# Patient Record
Sex: Female | Born: 1940 | Race: Black or African American | Hispanic: No | Marital: Single | Smoking: Never smoker
Health system: Southern US, Community
[De-identification: ages and names within clinical notes are randomized; demographics above are authoritative.]

## PROBLEM LIST (undated history)

## (undated) DIAGNOSIS — D649 Anemia, unspecified: Secondary | ICD-10-CM

## (undated) DIAGNOSIS — J45909 Unspecified asthma, uncomplicated: Secondary | ICD-10-CM

## (undated) DIAGNOSIS — M1990 Unspecified osteoarthritis, unspecified site: Secondary | ICD-10-CM

## (undated) HISTORY — PX: KNEE ARTHROSCOPY: SHX127

---
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
## 2017-07-27 ENCOUNTER — Other Ambulatory Visit: Payer: Self-pay

## 2017-07-27 ENCOUNTER — Encounter (HOSPITAL_COMMUNITY): Payer: Self-pay

## 2017-07-27 ENCOUNTER — Observation Stay (HOSPITAL_COMMUNITY)
Admission: AD | Admit: 2017-07-27 | Discharge: 2017-07-28 | Disposition: A | Discharge status: Home / Self Care | Payer: 59 | Source: Ambulatory Visit | Attending: Family Medicine | Admitting: Family Medicine

## 2017-07-27 DIAGNOSIS — Z880 Allergy status to penicillin: Secondary | ICD-10-CM | POA: Diagnosis not present

## 2017-07-27 DIAGNOSIS — Z79899 Other long term (current) drug therapy: Secondary | ICD-10-CM | POA: Diagnosis not present

## 2017-07-27 DIAGNOSIS — Z853 Personal history of malignant neoplasm of breast: Secondary | ICD-10-CM | POA: Insufficient documentation

## 2017-07-27 DIAGNOSIS — I10 Essential (primary) hypertension: Secondary | ICD-10-CM | POA: Insufficient documentation

## 2017-07-27 DIAGNOSIS — D649 Anemia, unspecified: Secondary | ICD-10-CM | POA: Diagnosis not present

## 2017-07-27 LAB — COMPREHENSIVE METABOLIC PANEL
ALBUMIN: 3.2 g/dL — AB (ref 3.5–5.0)
ALT: 6 U/L — ABNORMAL LOW (ref 14–54)
ANION GAP: 6 (ref 5–15)
AST: 17 U/L (ref 15–41)
Alkaline Phosphatase: 68 U/L (ref 38–126)
BUN: 16 mg/dL (ref 6–20)
CHLORIDE: 108 mmol/L (ref 101–111)
CO2: 23 mmol/L (ref 22–32)
Calcium: 8.8 mg/dL — ABNORMAL LOW (ref 8.9–10.3)
Creatinine, Ser: 1.14 mg/dL — ABNORMAL HIGH (ref 0.44–1.00)
GFR calc Af Amer: 52 mL/min — ABNORMAL LOW (ref >60)
GFR calc non Af Amer: 45 mL/min — ABNORMAL LOW (ref >60)
GLUCOSE: 91 mg/dL (ref 65–99)
POTASSIUM: 3.8 mmol/L (ref 3.5–5.1)
SODIUM: 137 mmol/L (ref 135–145)
Total Bilirubin: 0.3 mg/dL (ref 0.3–1.2)
Total Protein: 6.4 g/dL — ABNORMAL LOW (ref 6.5–8.1)

## 2017-07-27 LAB — CBC
HEMATOCRIT: 22.6 % — AB (ref 36.0–46.0)
Hemoglobin: 6.9 g/dL — CL (ref 12.0–15.0)
MCH: 24.1 pg — ABNORMAL LOW (ref 26.0–34.0)
MCHC: 30.5 g/dL (ref 30.0–36.0)
MCV: 79 fL (ref 78.0–100.0)
PLATELETS: 460 10*3/uL — AB (ref 150–400)
RBC: 2.86 MIL/uL — ABNORMAL LOW (ref 3.87–5.11)
RDW: 17.6 % — AB (ref 11.5–15.5)
WBC: 5.8 10*3/uL (ref 4.0–10.5)

## 2017-07-27 LAB — PREPARE RBC (CROSSMATCH)

## 2017-07-27 LAB — ABO/RH: ABO/RH(D): A POS

## 2017-07-27 MED ORDER — ENSURE ENLIVE PO LIQD
237.0000 mL | Freq: Two times a day (BID) | ORAL | Status: DC
  Administered 2017-07-28: 237 mL via ORAL

## 2017-07-27 MED ORDER — SODIUM CHLORIDE 0.9 % IV SOLN
Freq: Once | INTRAVENOUS | Status: AC
  Administered 2017-07-27: 22:00:00 via INTRAVENOUS

## 2017-07-27 MED ORDER — DEXTROSE 5 % IV SOLN
INTRAVENOUS | Status: AC
  Administered 2017-07-27: 20:00:00 via INTRAVENOUS

## 2017-07-27 NOTE — Progress Notes (Signed)
CRITICAL VALUE ALERT  Critical Value: Hgb 6.9  Date & Time Notied: 07/27/17 @ 2010  Provider Notified: Dr. Parke SimmersBland  Orders Received/Actions taken: transfuse 2 units PRBc's

## 2017-07-27 NOTE — Progress Notes (Signed)
Telephone order obtained from Lowella BandyV. Bland, MD for 2 units PRBC's with repeat H&H 2 hours post transfusion. Consent reviewed with pt and pt's niece, Shanda BumpsJessica. Pt and family member made aware of risks and allowed to ask questions-pt and family had no questions and pt is in agreement with receiving the 2 units of PRBC's. Will ctm.

## 2017-07-28 DIAGNOSIS — D649 Anemia, unspecified: Secondary | ICD-10-CM | POA: Diagnosis not present

## 2017-07-28 LAB — HEMOGLOBIN AND HEMATOCRIT, BLOOD
HEMATOCRIT: 33.8 % — AB (ref 36.0–46.0)
HEMOGLOBIN: 10.6 g/dL — AB (ref 12.0–15.0)

## 2017-07-28 NOTE — Progress Notes (Signed)
Pt D/C'd home tonight per order, accompanied by niece, Shanda BumpsJessica. IV removed and D/C paperwork reviewed with pt and family member. Pt and family did not have any questions at this time. Pt has a f/u appointment already scheduled with Dr. Parke SimmersBland for next week. Belongings sent home with family member. Pt brought out to car by wheelchair. Will ctm.

## 2017-07-28 NOTE — Discharge Summary (Signed)
Mary ClackDorothy G Bagent MRN: [id number] DOB/AGE: 78-25-1942 78 y.o.  Admit date: 07/27/2017 Discharge date: 07/28/2017  Primary Care Physician:  Patient, No Pcp Per   Discharge Diagnoses:   There are no active problems to display for this patient. This patient has anemia as a problems that is present.  She also has decreased energy and some memory failure DISCHARGE MEDICATION: Allergies as of 07/28/2017      Reactions   Penicillins Hives   Has patient had a PCN reaction causing immediate rash, facial/tongue/throat swelling, SOB or lightheadedness with hypotension: Yes Has patient had a PCN reaction causing severe rash involving mucus membranes or skin necrosis: No Has patient had a PCN reaction that required hospitalization: Unknown Has patient had a PCN reaction occurring within the last 10 years: Unknown If all of the above answers are "NO", then may proceed with Cephalosporin use.      Medication List    TAKE these medications   acetaminophen 650 MG CR tablet Commonly known as:  TYLENOL Take 650 mg by mouth every 8 (eight) hours.   celecoxib 200 MG capsule Commonly known as:  CELEBREX Take 200 mg by mouth daily.   Fluticasone-Salmeterol 250-50 MCG/DOSE Aepb Commonly known as:  ADVAIR Inhale 1 puff into the lungs every 12 (twelve) hours.   omeprazole 20 MG capsule Commonly known as:  PRILOSEC Take 20 mg by mouth daily.   triamcinolone cream 0.1 % Commonly known as:  KENALOG Apply 1 application topically 2 (two) times daily.   triamterene-hydrochlorothiazide 37.5-25 MG tablet Commonly known as:  MAXZIDE-25 Take 1 tablet by mouth daily.   VITAMIN D3 PO Take 1 tablet by mouth daily.         Consults:    SIGNIFICANT DIAGNOSTIC STUDIES:  No results found.   ECHO:     CARDIAC CATH & OTHER PROCEDURES:  No results found for this or any previous visit (from the past 240 hour(s)).  BRIEF ADMITTING H & P    Hospital Course:  Present on  Admission: **None** This elerderly female has recently [place]  Her grand daughter has moved her in with her due to some memory failure after her husband died.  She complains of fatigue.  She was found on her first visit to our office to be anemic.   It appears that she had been asked by her previous doctor to get a colonoscopy but this had not been done.     Disposition and Follow-up:    She has been scheduled to see GI and to have a CT of the colon to further delineate her colon problems   Discharge Instructions    Diet - low sodium heart healthy   Complete by:  As directed    Increase activity slowly   Complete by:  As directed       DISCHARGE EXAM:  She appears in good spirits.  She says she has a little more energy . Head is normocephalic  She has poor dental health with missing teeth and caries  .  Chest was clear, Cor was regular. No S3 or S4.  No thrills or heaves present . Abdomen was soft, no masses were noted.  Extremities showed good pulses present .  Neurologically she was alert and talkative and appropriate.  She moves all of her extreities.  Blood pressure (!) 110/54, pulse 65, temperature 98 F (36.7 C), temperature source Oral, resp. rate 16, height 5\' 2"  (1.575 m), weight 42.8 kg (94 lb 5.7  oz), SpO2 100 %.  Recent Labs    07/27/17 1926  NA 137  K 3.8  CL 108  CO2 23  GLUCOSE 91  BUN 16  CREATININE 1.14*  CALCIUM 8.8*   Recent Labs    07/27/17 1926  AST 17  ALT 6*  ALKPHOS 68  BILITOT 0.3  PROT 6.4*  ALBUMIN 3.2*   No results for input(s): LIPASE, AMYLASE in the last 72 hours. Recent Labs    07/27/17 1926 07/28/17 0601  WBC 5.8  --   HGB 6.9* 10.6*  HCT 22.6* 33.8*  MCV 79.0  --   PLT 460*  --    We will keep an eye on her blood count.  Her celoberex will be stopped.  We will monitor her renal functions and have instructed her grand daughter in increasing her protein . She will be seen in the office in 1 week  Total time  spent including face to face and decision making was greater than 30 minutes  Signed: Rithika Seel J 07/28/2017, 10:15 PM

## 2017-07-28 NOTE — H&P (Signed)
[facility] History and Physical     History and Physical:    Mary ClackDorothy G Villa   MVH:[id number] DOB: 10-Oct-1940 DOA: 07/27/2017  PCP: Patient, No Pcp Per   Chief Complaint: Anemia   History of Present Illness:   Mary Villa is  76 y.o.AA female .  She has been moved here by her grand daughter to live from [place].  On her first visit to our office labs were drawn which showed a Hg of 6.  She had been noted to be weak and to be a little unsteady on her feet   She was found to have a low BP in the office and it was felt that she needed to be transfused   She was not found to have any discomfort at that time.   ROS:   ROS  Constitutional: No fever, no chills;  Appetite normal;  20 plus pounds of weight loss,  some fatigue.   HEENT: No blurry vision, no diplopia, no pharyngitis, no dysphagia  CV: No chest pain, no palpitations, no PND, no orthopnea, no edema.   Resp: No SOB, no cough, no pleuritic pain.  GI: No nausea, no vomiting, no diarrhea, no melena, no hematochezia, no constipation, no abdominal pain.   GU: No dysuria, no hematuria, no frequency, no urgency.  MSK: No myalgias, no arthralgias.   Neuro:  No headache, no focal neurological deficits, no history of seizures.   Psych: No depression, no anxiety.   Endo: No heat intolerance, no cold intolerance, no polyuria, no polydipsia   Skin: No rashes, no skin lesions.   Heme: No easy bruising.   Travel history: No recent travel.   Past Medical History:   History reviewed. No pertinent past medical history.  She has a hx of hypertension for many years  Past Surgical History:   History reviewed. No pertinent surgical history.She has had breast cancer treated with a lumpectomy in the 90's according to the grand daughter   Social History:   Social History   Socioeconomic History  . Marital status: Single    Spouse name: Not on file  . Number of children: Not on file  . Years of education: Not on file  .  Highest education level: Not on file  Social Needs  . Financial resource strain: Not on file  . Food insecurity - worry: Not on file  . Food insecurity - inability: Not on file  . Transportation needs - medical: Not on file  . Transportation needs - non-medical: Not on file  Occupational History  . Not on file  Tobacco Use  . Smoking status: Not on file  Substance and Sexual Activity  . Alcohol use: Not on file  . Drug use: Not on file  . Sexual activity: Not on file  Other Topics Concern  . Not on file  Social History Narrative  . Not on file   Ambulatory status: ambulation independently Family history:   History reviewed. No pertinent family history.  Allergies   Penicillins  Current Medications:   Prior to Admission medications   Medication Sig Start Date End Date Taking? Authorizing Provider  acetaminophen (TYLENOL) 650 MG CR tablet Take 650 mg by mouth every 8 (eight) hours.   Yes [provider]  celecoxib (CELEBREX) 200 MG capsule Take 200 mg by mouth daily.   Yes [provider]  Cholecalciferol (VITAMIN D3 PO) Take 1 tablet by mouth daily.   Yes [provider]  Fluticasone-Salmeterol (ADVAIR) 250-50  MCG/DOSE AEPB Inhale 1 puff into the lungs every 12 (twelve) hours.   Yes [provider]  omeprazole (PRILOSEC) 20 MG capsule Take 20 mg by mouth daily.   Yes [provider]  triamcinolone cream (KENALOG) 0.1 % Apply 1 application topically 2 (two) times daily.   Yes [provider]  triamterene-hydrochlorothiazide (MAXZIDE-25) 37.5-25 MG tablet Take 1 tablet by mouth daily. 07/13/17  Yes [provider]    Physical Exam:   Vitals:   07/28/17 0201 07/28/17 0439 07/28/17 0445 07/28/17 1449  BP: (!) 119/58  125/67 (!) 110/54  Pulse: 69  64 65  Resp: 16  16 16   Temp: 98.6 F (37 C)  98.6 F (37 C) 98 F (36.7 C)  TempSrc: Oral  Oral Oral  SpO2: 100%  100% 100%  Weight:  42.8 kg (94 lb 5.7 oz)     Height:  5\' 2"  (1.575 m)       Physical Exam: Blood pressure (!) 110/54, pulse 65, temperature 98 F (36.7 C), temperature source Oral, resp. rate 16, height 5\' 2"  (1.575 m), weight 42.8 kg (94 lb 5.7 oz), SpO2 100 %. Gen: No acute distress. Head: Normocephalic, atraumatic. Eyes: PERRL, EOMI, sclerae nonicteric. Mouth: Oropharynx Neck: Supple, no thyromegaly, no lymphadenopathy, no jugular venous distention. Chest:  Clear  CV: Cor was regular. No S 3 or S 4 Abdomen: Soft, nontender, nondistended with normal active bowel sounds. Extremities: Extremities Skin: Warm and dry. Neuro: Alert and oriented times; cranial nerves II through XII grossly intact. Psych: Mood and affect normal.   Data Review:   Her labs verified her anemia Labs: Basic Metabolic Panel: Recent Labs  Lab 07/27/17 1926  NA 137  K 3.8  CL 108  CO2 23  GLUCOSE 91  BUN 16  CREATININE 1.14*  CALCIUM 8.8*   Liver Function Tests: Recent Labs  Lab 07/27/17 1926  AST 17  ALT 6*  ALKPHOS 68  BILITOT 0.3  PROT 6.4*  ALBUMIN 3.2*   No results for input(s): LIPASE, AMYLASE in the last 168 hours. No results for input(s): AMMONIA in the last 168 hours. CBC: Recent Labs  Lab 07/27/17 1926 07/28/17 0601  WBC 5.8  --   HGB 6.9* 10.6*  HCT 22.6* 33.8*  MCV 79.0  --   PLT 460*  --    Cardiac Enzymes: No results for input(s): CKTOTAL, CKMB, CKMBINDEX, TROPONINI in the last 168 hours.  BNP (last 3 results) No results for input(s): PROBNP in the last 8760 hours. CBG: No results for input(s): GLUCAP in the last 168 hours.  Radiographic Studies: No results found. *I have personally reviewed the images above  EKG: Independently reviewed.    Assessment/Plan:  76 yr old AA female recently moved to the area was  found to have anemia.  She was felt to be symptomatic and transfusion was felt appropriate.  Our plan at present is to transfuse her now and perform a complete GI work up as an outpatient.   Of note is that she has never had a colonoscopy done.   We will monitor her renal functions Active Problems:   * No active hospital problems. *  DVT prophylaxis  Code Status: Full. Family Communication: With the grand daughter Disposition Plan: Home when stable.  30 min time spent with patient  Geraldo PitterBLAND,Irelyn Perfecto J  [phone]   07/28/2017, 9:43 PM

## 2017-07-28 NOTE — Significant Event (Signed)
Patient was admitted for transfusion.  Patient received transfusion and Dr. Ocie BobVita Bland, patient's attending, requested me to put discharge order and I have confirmed with patient's nurse that patient is doing fine after transfusion.  Labs vital signs reviewed.  Discharge order placed.  Mary Villa.

## 2017-07-28 NOTE — Care Management Obs Status (Signed)
MEDICARE OBSERVATION STATUS NOTIFICATION   Patient Details  Name: Mary Villa MRN: [id number] Date of Birth: 02-16-1941   Medicare Observation Status Notification Given:       Epifanio LeschesCole, Layla Gramm Hudson, RN 07/28/2017, 3:25 PM

## 2017-07-29 LAB — TYPE AND SCREEN
ABO/RH(D): A POS
ANTIBODY SCREEN: NEGATIVE
UNIT DIVISION: 0
Unit division: 0

## 2017-07-29 LAB — BPAM RBC
BLOOD PRODUCT EXPIRATION DATE: 201812242359
Blood Product Expiration Date: 201812242359
ISSUE DATE / TIME: 201812122148
ISSUE DATE / TIME: 201812130125
UNIT TYPE AND RH: 6200
Unit Type and Rh: 6200

## 2017-08-11 DIAGNOSIS — D649 Anemia, unspecified: Secondary | ICD-10-CM

## 2017-08-11 NOTE — Assessment & Plan Note (Signed)
Anemia is being worked up

## 2017-08-16 ENCOUNTER — Encounter (HOSPITAL_COMMUNITY): Payer: Self-pay | Admitting: Emergency Medicine

## 2017-08-16 ENCOUNTER — Emergency Department (HOSPITAL_COMMUNITY): Payer: 59

## 2017-08-16 ENCOUNTER — Other Ambulatory Visit: Payer: Self-pay

## 2017-08-16 ENCOUNTER — Emergency Department (HOSPITAL_COMMUNITY)
Admission: EM | Admit: 2017-08-16 | Discharge: 2017-08-17 | Disposition: A | Discharge status: Home / Self Care | Payer: 59 | Attending: Emergency Medicine | Admitting: Emergency Medicine

## 2017-08-16 DIAGNOSIS — D649 Anemia, unspecified: Secondary | ICD-10-CM | POA: Insufficient documentation

## 2017-08-16 DIAGNOSIS — W108XXA Fall (on) (from) other stairs and steps, initial encounter: Secondary | ICD-10-CM | POA: Insufficient documentation

## 2017-08-16 DIAGNOSIS — S40011A Contusion of right shoulder, initial encounter: Secondary | ICD-10-CM | POA: Diagnosis not present

## 2017-08-16 DIAGNOSIS — Z79899 Other long term (current) drug therapy: Secondary | ICD-10-CM | POA: Insufficient documentation

## 2017-08-16 DIAGNOSIS — Y998 Other external cause status: Secondary | ICD-10-CM | POA: Diagnosis not present

## 2017-08-16 DIAGNOSIS — Y9389 Activity, other specified: Secondary | ICD-10-CM | POA: Diagnosis not present

## 2017-08-16 DIAGNOSIS — Y9289 Other specified places as the place of occurrence of the external cause: Secondary | ICD-10-CM | POA: Insufficient documentation

## 2017-08-16 DIAGNOSIS — W19XXXA Unspecified fall, initial encounter: Secondary | ICD-10-CM

## 2017-08-16 DIAGNOSIS — S4991XA Unspecified injury of right shoulder and upper arm, initial encounter: Secondary | ICD-10-CM | POA: Diagnosis present

## 2017-08-16 MED ORDER — ACETAMINOPHEN 500 MG PO TABS
1000.0000 mg | ORAL_TABLET | Freq: Once | ORAL | Status: AC
  Administered 2017-08-16: 1000 mg via ORAL
  Filled 2017-08-16: qty 2

## 2017-08-16 NOTE — ED Triage Notes (Signed)
Patient BIB [facility] from home after slipping down last 3 steps of staircase. Pt c/o rt sided pain, rt hip tender to touch, rt elbow and shoulder pain but was able to move extremity to remove sweater. Pt mumbles at baseline per EMS.

## 2017-08-16 NOTE — ED Notes (Signed)
Bed: [id number] Expected date:  Expected time:  Means of arrival:  Comments: 77 yo f  Fall

## 2017-08-17 DIAGNOSIS — S40011A Contusion of right shoulder, initial encounter: Secondary | ICD-10-CM | POA: Diagnosis not present

## 2017-08-17 MED ORDER — TRAMADOL HCL 50 MG PO TABS: ORAL_TABLET | ORAL | 0 refills | Status: DC

## 2017-08-17 MED ORDER — TRAMADOL HCL 50 MG PO TABS
50.0000 mg | ORAL_TABLET | Freq: Once | ORAL | Status: AC
  Administered 2017-08-17: 50 mg via ORAL
  Filled 2017-08-17: qty 1

## 2017-08-17 MED ORDER — ACETAMINOPHEN 500 MG PO TABS: 1000.0000 mg | ORAL_TABLET | Freq: Four times a day (QID) | ORAL | 0 refills | Status: DC | PRN

## 2017-08-17 NOTE — ED Provider Notes (Signed)
[facility]-EMERGENCY DEPT Provider Note   CSN: [id number] Arrival date & time: 08/16/17  2036     History   Chief Complaint Chief Complaint  Patient presents with  . Fall    HPI Mary Villa is a 77 y.o. female.  HPI Patient fell at the end of the stairs at home.  She landed on her right shoulder.  This is the only area of pain.  Reports it hurts to move it and she points to the back of her shoulder.  No loss of consciousness no headache. History reviewed. No pertinent past medical history.  Patient Active Problem List   Diagnosis Date Noted  . Anemia 08/11/2017    History reviewed. No pertinent surgical history.  OB History    No data available       Home Medications    Prior to Admission medications   Medication Sig Start Date End Date Taking? Authorizing Provider  acetaminophen (TYLENOL) 650 MG CR tablet Take 650 mg by mouth every 8 (eight) hours as needed for pain.    Yes [provider]  celecoxib (CELEBREX) 200 MG capsule Take 200 mg by mouth daily.   Yes [provider]  Cholecalciferol (VITAMIN D3 PO) Take 1 tablet by mouth daily.   Yes [provider]  Fluticasone-Salmeterol (ADVAIR) 250-50 MCG/DOSE AEPB Inhale 1 puff into the lungs every 12 (twelve) hours.   Yes [provider]  omeprazole (PRILOSEC) 20 MG capsule Take 20 mg by mouth daily.   Yes [provider]  triamcinolone cream (KENALOG) 0.1 % Apply 1 application topically 2 (two) times daily.   Yes [provider]  triamterene-hydrochlorothiazide (MAXZIDE-25) 37.5-25 MG tablet Take 1 tablet by mouth daily. 07/13/17  Yes [provider]  acetaminophen (TYLENOL) 500 MG tablet Take 2 tablets (1,000 mg total) by mouth every 6 (six) hours as needed. 08/17/17   Arby Barrette, MD  traMADol (ULTRAM) 50 MG tablet 1-2 tablets every 6 hours as needed for pain. 08/17/17   Arby Barrette, MD    Family History History reviewed.  No pertinent family history.  Social History Social History   Tobacco Use  . Smoking status: Never Smoker  . Smokeless tobacco: Never Used  Substance Use Topics  . Alcohol use: Not on file  . Drug use: Not on file     Allergies   Penicillins   Review of Systems Review of Systems 10 Systems reviewed and are negative for acute change except as noted in the HPI.   Physical Exam Updated Vital Signs BP 113/69 (BP Location: Left Arm)   Pulse 72   Temp 98.3 F (36.8 C) (Oral)   Resp 18   SpO2 99%   Physical Exam  Constitutional: She appears well-developed and well-nourished.  Patient is frail but alert and appropriate.  No respiratory distress.  HENT:  Head: Normocephalic and atraumatic.  Eyes: Conjunctivae and EOM are normal.  Neck: Neck supple.  No C-spine tenderness.  Cardiovascular: Normal rate and regular rhythm.  No murmur heard. Pulmonary/Chest: Effort normal and breath sounds normal. No respiratory distress. She exhibits no tenderness.  Abdominal: Soft. She exhibits no distension. There is no tenderness.  Musculoskeletal: Normal range of motion. She exhibits no edema.  Reports pain at the right shoulder.  There is no deformity or displacement.  Patient can do range of motion she reports is uncomfortable but she has rotational movement and flexion and extension.  Remainder of extremities to range of motion without difficulty.  Neurological: She is alert. No cranial nerve deficit. She exhibits normal muscle tone. Coordination normal.  Skin: Skin is warm and dry.  Psychiatric: She has a normal mood and affect.  Nursing note and vitals reviewed.    ED Treatments / Results  Labs (all labs ordered are listed, but only abnormal results are displayed) Labs Reviewed - No data to display  EKG  EKG Interpretation None       Radiology Dg Shoulder Right  Result Date: 08/17/2017 CLINICAL DATA:  Right shoulder pain after fall down steps today. EXAM: RIGHT SHOULDER  - 2+ VIEW COMPARISON:  None. FINDINGS: There is no definite evidence of acute fracture or dislocation of the right shoulder. However, there is increased overlap of the humeral head with respect to the glenoid. This is probably due to patient positioning, but due to limitations in patient positioning, a posterior dislocation is not excluded. Correlation with physical examination is recommended. No focal bone lesion or bone destruction. Surgical clips in the right axilla. IMPRESSION: No definite evidence of acute fracture or dislocation of the right shoulder. Increased overlap at the glenohumeral joint may be due to patient positioning but a posterior dislocation is not excluded. Electronically Signed   By: Burman NievesWilliam  Stevens M.D.   On: 08/17/2017 00:10    Procedures Procedures (including critical care time)  Medications Ordered in ED Medications  traMADol (ULTRAM) tablet 50 mg (not administered)  acetaminophen (TYLENOL) tablet 1,000 mg (1,000 mg Oral Given 08/16/17 2343)     Initial Impression / Assessment and Plan / ED Course  I have reviewed the triage vital signs and the nursing notes.  Pertinent labs & imaging results that were available during my care of the patient were reviewed by me and considered in my medical decision making (see chart for details).      Final Clinical Impressions(s) / ED Diagnoses   Final diagnoses:  Fall, initial encounter  Contusion of right shoulder, initial encounter  X-ray shows no fracture.  Clinical exam does not correlate with any dislocation.  Will treat with Tylenol and tramadol for pain.  ED Discharge Orders        Ordered    acetaminophen (TYLENOL) 500 MG tablet  Every 6 hours PRN     08/17/17 0053    traMADol (ULTRAM) 50 MG tablet     08/17/17 0053       Arby BarrettePfeiffer, Claris Guymon, MD 08/17/17 [phone]

## 2017-09-20 ENCOUNTER — Encounter (HOSPITAL_COMMUNITY): Payer: Self-pay

## 2017-09-20 ENCOUNTER — Emergency Department (HOSPITAL_COMMUNITY): Payer: 59

## 2017-09-20 ENCOUNTER — Other Ambulatory Visit: Payer: Self-pay

## 2017-09-20 DIAGNOSIS — Z96652 Presence of left artificial knee joint: Secondary | ICD-10-CM | POA: Diagnosis not present

## 2017-09-20 DIAGNOSIS — Y999 Unspecified external cause status: Secondary | ICD-10-CM | POA: Insufficient documentation

## 2017-09-20 DIAGNOSIS — Z79899 Other long term (current) drug therapy: Secondary | ICD-10-CM | POA: Insufficient documentation

## 2017-09-20 DIAGNOSIS — W108XXA Fall (on) (from) other stairs and steps, initial encounter: Secondary | ICD-10-CM | POA: Diagnosis not present

## 2017-09-20 DIAGNOSIS — Y929 Unspecified place or not applicable: Secondary | ICD-10-CM | POA: Insufficient documentation

## 2017-09-20 DIAGNOSIS — J45909 Unspecified asthma, uncomplicated: Secondary | ICD-10-CM | POA: Diagnosis not present

## 2017-09-20 DIAGNOSIS — Y939 Activity, unspecified: Secondary | ICD-10-CM | POA: Diagnosis not present

## 2017-09-20 DIAGNOSIS — M25562 Pain in left knee: Secondary | ICD-10-CM | POA: Insufficient documentation

## 2017-09-20 NOTE — ED Triage Notes (Signed)
Pt comes with son, witnessed fall this evening,, when tripping on stairs, no LOC, did not hit head, pain and swelling to L knee.

## 2017-09-21 ENCOUNTER — Emergency Department (HOSPITAL_COMMUNITY)
Admission: EM | Admit: 2017-09-21 | Discharge: 2017-09-21 | Disposition: A | Discharge status: Home / Self Care | Payer: 59 | Attending: Emergency Medicine | Admitting: Emergency Medicine

## 2017-09-21 DIAGNOSIS — M25562 Pain in left knee: Secondary | ICD-10-CM

## 2017-09-21 HISTORY — DX: Unspecified asthma, uncomplicated: J45.909

## 2017-09-21 HISTORY — DX: Unspecified osteoarthritis, unspecified site: M19.90

## 2017-09-21 MED ORDER — ACETAMINOPHEN 325 MG PO TABS
650.0000 mg | ORAL_TABLET | Freq: Once | ORAL | Status: AC
  Administered 2017-09-21: 650 mg via ORAL
  Filled 2017-09-21: qty 2

## 2017-09-21 NOTE — ED Provider Notes (Signed)
[facility] Provider Note   CSN: [id number] Arrival date & time: 09/20/17  2020     History   Chief Complaint Chief Complaint  Patient presents with  . Fall    HPI Mary Villa is a 77 y.o. female who presents with left knee pain after a mechanical fall. She is accompanied by her son. He states she was walking down stairs and lost her balance and fell on to her left knee. The floor was carpeted. She was able to stand up and take some steps however it was hurting so they decided to come to the ED. 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 She now has some mild, bilateral ankle swelling as well but they do not hurt. No numbness or weakness or significant swelling. No wounds. She does not ambulate with a cane or walker but her son states she probably should.  HPI  Past Medical History:  Diagnosis Date  . Arthritis   . Asthma     Patient Active Problem List   Diagnosis Date Noted  . Anemia 08/11/2017    Past Surgical History:  Procedure Laterality Date  . KNEE ARTHROSCOPY      OB History    No data available       Home Medications    Prior to Admission medications   Medication Sig Start Date End Date Taking? Authorizing Provider  acetaminophen (TYLENOL) 500 MG tablet Take 2 tablets (1,000 mg total) by mouth every 6 (six) hours as needed. 08/17/17  Yes Arby BarrettePfeiffer, Marcy, MD  Cholecalciferol (VITAMIN D3 PO) Take 1 tablet by mouth daily.   Yes [provider]  Fluticasone-Salmeterol (ADVAIR) 250-50 MCG/DOSE AEPB Inhale 1 puff into the lungs every 12 (twelve) hours.   Yes [provider]  omeprazole (PRILOSEC) 20 MG capsule Take 20 mg by mouth daily.   Yes [provider]  traMADol (ULTRAM) 50 MG tablet 1-2 tablets every 6 hours as needed for pain. 08/17/17  Yes Arby BarrettePfeiffer, Marcy, MD  triamcinolone cream (KENALOG) 0.1 % Apply 1 application topically 2 (two) times daily.   Yes [provider]    triamterene-hydrochlorothiazide (MAXZIDE-25) 37.5-25 MG tablet Take 1 tablet by mouth daily. 07/13/17  Yes [provider]    Family History No family history on file.  Social History Social History   Tobacco Use  . Smoking status: Never Smoker  . Smokeless tobacco: Never Used  Substance Use Topics  . Alcohol use: Not on file  . Drug use: Not on file     Allergies   Penicillins   Review of Systems Review of Systems  Respiratory: Negative for shortness of breath.   Cardiovascular: Negative for chest pain.  Musculoskeletal: Positive for arthralgias, joint swelling and myalgias. Negative for gait problem.  Skin: Negative for wound.  Neurological: Negative for weakness, numbness and headaches.  All other systems reviewed and are negative.    Physical Exam Updated Vital Signs BP 121/79   Pulse 63   Temp 98.1 F (36.7 C) (Oral)   Resp 16   SpO2 100%   Physical Exam  Constitutional: She is oriented to person, place, and time. She appears well-developed and well-nourished. No distress.  Sleeping in bed  HENT:  Head: Normocephalic and atraumatic.  Eyes: Conjunctivae are normal. Pupils are equal, round, and reactive to light. Right eye exhibits no discharge. Left eye exhibits no discharge. No scleral icterus.  Neck: Normal range of motion.  Cardiovascular: Normal  rate.  Pulmonary/Chest: Effort normal. No respiratory distress.  Abdominal: She exhibits no distension.  Musculoskeletal:  Left knee: No obvious swelling, deformity, or warmth. Mild bruising. Surgical scar noted. Diffuse, mild tenderness to palpation. FROM. 5/5 strength. N/V intact. Ambulatory   Neurological: She is alert and oriented to person, place, and time.  Skin: Skin is warm and dry.  Psychiatric: She has a normal mood and affect. Her behavior is normal.  Nursing note and vitals reviewed.    ED Treatments / Results  Labs (all labs ordered are listed, but only abnormal results are  displayed) Labs Reviewed - No data to display  EKG  EKG Interpretation None       Radiology Dg Knee Complete 4 Views Left  Result Date: 09/20/2017 CLINICAL DATA:  Status post fall, with left knee pain and swelling, acute onset. Initial encounter. EXAM: LEFT KNEE - COMPLETE 4+ VIEW COMPARISON:  None. FINDINGS: There is no evidence of fracture or dislocation. 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 There is no evidence of loosening. Diffuse heterotopic bone formation is noted about the popliteal fossa. No significant joint effusion is seen. Mild soft tissue swelling is noted about the knee. IMPRESSION: No evidence of fracture or dislocation. Visualized hardware appears grossly intact. No evidence of loosening. Electronically Signed   By: Roanna Raider M.D.   On: 09/20/2017 21:20    Procedures Procedures (including critical care time)  Medications Ordered in ED Medications  acetaminophen (TYLENOL) tablet 650 mg (650 mg Oral Given 09/21/17 0701)     Initial Impression / Assessment and Plan / ED Course  I have reviewed the triage vital signs and the nursing notes.  Pertinent labs & imaging results that were available during my care of the patient were reviewed by me and considered in my medical decision making (see chart for details).  77 year old with left knee pain after a mechanical fall. Vitals are normal. She is calm and comfortable appearing. Xray of the left knee is negative. She was given Tylenol and a knee sleeve and was able to ambulate. Shared visit with Dr. Nicanor Alcon. Return precautions were given.  Final Clinical Impressions(s) / ED Diagnoses   Final diagnoses:  Acute pain of left knee    ED Discharge Orders    None       Bethel Born, PA-C 09/21/17 0804    Palumbo, April, MD 09/21/17 [phone]

## 2017-09-21 NOTE — ED Notes (Signed)
Pt ambulatory in hallway with 1 person assist from this NT. Pt tolerated well and reported feeling well while ambulating.

## 2017-09-21 NOTE — Discharge Instructions (Signed)
Please wear knee sleeve for support for the next couple of days Take Tylenol for pain Follow up with your doctor Return if worsening

## 2017-11-04 ENCOUNTER — Emergency Department (HOSPITAL_COMMUNITY): Payer: 59

## 2017-11-04 ENCOUNTER — Encounter (HOSPITAL_COMMUNITY): Payer: Self-pay | Admitting: Emergency Medicine

## 2017-11-04 ENCOUNTER — Other Ambulatory Visit: Payer: Self-pay

## 2017-11-04 ENCOUNTER — Emergency Department (HOSPITAL_COMMUNITY)
Admission: EM | Admit: 2017-11-04 | Discharge: 2017-11-05 | Disposition: A | Discharge status: Home / Self Care | Payer: 59 | Attending: Emergency Medicine | Admitting: Emergency Medicine

## 2017-11-04 DIAGNOSIS — W19XXXA Unspecified fall, initial encounter: Secondary | ICD-10-CM

## 2017-11-04 DIAGNOSIS — M25511 Pain in right shoulder: Secondary | ICD-10-CM | POA: Insufficient documentation

## 2017-11-04 DIAGNOSIS — Z79899 Other long term (current) drug therapy: Secondary | ICD-10-CM | POA: Insufficient documentation

## 2017-11-04 DIAGNOSIS — J45909 Unspecified asthma, uncomplicated: Secondary | ICD-10-CM | POA: Diagnosis not present

## 2017-11-04 DIAGNOSIS — M25531 Pain in right wrist: Secondary | ICD-10-CM | POA: Diagnosis not present

## 2017-11-04 DIAGNOSIS — M25551 Pain in right hip: Secondary | ICD-10-CM | POA: Insufficient documentation

## 2017-11-04 DIAGNOSIS — M542 Cervicalgia: Secondary | ICD-10-CM | POA: Diagnosis not present

## 2017-11-04 DIAGNOSIS — M25512 Pain in left shoulder: Secondary | ICD-10-CM | POA: Insufficient documentation

## 2017-11-04 DIAGNOSIS — M25532 Pain in left wrist: Secondary | ICD-10-CM | POA: Diagnosis not present

## 2017-11-04 DIAGNOSIS — W010XXA Fall on same level from slipping, tripping and stumbling without subsequent striking against object, initial encounter: Secondary | ICD-10-CM | POA: Insufficient documentation

## 2017-11-04 DIAGNOSIS — M25562 Pain in left knee: Secondary | ICD-10-CM | POA: Insufficient documentation

## 2017-11-04 DIAGNOSIS — R55 Syncope and collapse: Secondary | ICD-10-CM | POA: Diagnosis not present

## 2017-11-04 DIAGNOSIS — R51 Headache: Secondary | ICD-10-CM | POA: Diagnosis not present

## 2017-11-04 NOTE — ED Provider Notes (Signed)
[facility]-EMERGENCY DEPT Provider Note   CSN: [id number] Arrival date & time: 11/04/17  2054     History   Chief Complaint Chief Complaint  Patient presents with  . Fall    HPI Rowe ClackDorothy G Gibler is a 77 y.o. female.  The history is provided by the patient and medical records. No language interpreter was used.  Fall  This is a new problem. The current episode started less than 1 hour ago. The problem occurs rarely. The problem has not changed since onset.Associated symptoms include headaches. Pertinent negatives include no chest pain, no abdominal pain and no shortness of breath. Nothing aggravates the symptoms. Nothing relieves the symptoms. She has tried nothing for the symptoms. The treatment provided no relief.    Past Medical History:  Diagnosis Date  . Arthritis   . Asthma     Patient Active Problem List   Diagnosis Date Noted  . Anemia 08/11/2017    Past Surgical History:  Procedure Laterality Date  . KNEE ARTHROSCOPY       OB History   None      Home Medications    Prior to Admission medications   Medication Sig Start Date End Date Taking? Authorizing Provider  acetaminophen (TYLENOL) 500 MG tablet Take 2 tablets (1,000 mg total) by mouth every 6 (six) hours as needed. 08/17/17   Arby BarrettePfeiffer, Marcy, MD  Cholecalciferol (VITAMIN D3 PO) Take 1 tablet by mouth daily.    [provider]  Fluticasone-Salmeterol (ADVAIR) 250-50 MCG/DOSE AEPB Inhale 1 puff into the lungs every 12 (twelve) hours.    [provider]  omeprazole (PRILOSEC) 20 MG capsule Take 20 mg by mouth daily.    [provider]  traMADol (ULTRAM) 50 MG tablet 1-2 tablets every 6 hours as needed for pain. 08/17/17   Arby BarrettePfeiffer, Marcy, MD  triamcinolone cream (KENALOG) 0.1 % Apply 1 application topically 2 (two) times daily.    [provider]  triamterene-hydrochlorothiazide (MAXZIDE-25) 37.5-25 MG tablet Take 1 tablet by mouth daily. 07/13/17    [provider]    Family History No family history on file.  Social History Social History   Tobacco Use  . Smoking status: Never Smoker  . Smokeless tobacco: Never Used  Substance Use Topics  . Alcohol use: Not on file  . Drug use: Not on file     Allergies   Penicillins   Review of Systems Review of Systems  Constitutional: Negative for chills, diaphoresis, fatigue and fever.  HENT: Negative for congestion.   Eyes: Negative for visual disturbance.  Respiratory: Negative for cough, chest tightness, shortness of breath, wheezing and stridor.   Cardiovascular: Negative for chest pain and palpitations.  Gastrointestinal: Negative for abdominal pain, constipation, diarrhea, nausea and vomiting.  Genitourinary: Negative for dysuria, flank pain and frequency.  Musculoskeletal: Positive for neck pain. Negative for back pain and neck stiffness.  Skin: Negative for rash and wound.  Neurological: Positive for headaches. Negative for dizziness, weakness, light-headedness and numbness.  Psychiatric/Behavioral: Negative for agitation.  All other systems reviewed and are negative.    Physical Exam Updated Vital Signs BP (!) 152/67 (BP Location: Right Arm)   Pulse 97   Temp 98.5 F (36.9 C) (Oral)   Resp 18   SpO2 100%   Physical Exam  Constitutional: She is oriented to person, place, and time. She appears well-developed and well-nourished. No distress.  HENT:  Head: Normocephalic.  Nose: Nose normal.  Mouth/Throat: Oropharynx is clear and moist.  No oropharyngeal exudate.  Eyes: Pupils are equal, round, and reactive to light. Conjunctivae and EOM are normal.  Neck: Normal range of motion. No JVD present. Muscular tenderness present.    Cardiovascular: Normal rate.  No murmur heard. Pulmonary/Chest: Effort normal and breath sounds normal. She has no wheezes. She exhibits no tenderness.  Abdominal: Soft. Bowel sounds are normal. She exhibits no distension. There  is no tenderness.  Musculoskeletal: She exhibits tenderness. She exhibits no edema or deformity.       Right shoulder: She exhibits tenderness.       Left shoulder: She exhibits tenderness.       Right wrist: She exhibits tenderness.       Left wrist: She exhibits tenderness.       Left knee: Tenderness found.       Arms:      Legs: Neurological: She is alert and oriented to person, place, and time. No sensory deficit. She exhibits normal muscle tone.  Skin: Capillary refill takes less than 2 seconds. No rash noted. She is not diaphoretic. No erythema.  Psychiatric: She has a normal mood and affect.  Nursing note and vitals reviewed.    ED Treatments / Results  Labs (all labs ordered are listed, but only abnormal results are displayed) Labs Reviewed - No data to display  EKG None  Radiology Dg Chest 2 View  Result Date: 11/04/2017 CLINICAL DATA:  Trip and unwitnessed fall. Pain in bilateral shoulders, bilateral wrists, right hip and left knee. EXAM: CHEST - 2 VIEW COMPARISON:  None. FINDINGS: The cardiomediastinal contours are normal. Mild aortic atherosclerosis. Pulmonary vasculature is normal. Biapical pleuroparenchymal scarring. No consolidation, pleural effusion, or pneumothorax. Surgical clips in both axilla. Post left mastectomy. No acute osseous abnormalities are seen. The bones are under mineralized, limiting assessment of the thoracic spine. IMPRESSION: No evidence of acute traumatic injury to the thorax. Electronically Signed   By: Rubye Oaks M.D.   On: 11/04/2017 23:33   Dg Shoulder Right  Result Date: 11/04/2017 CLINICAL DATA:  Trip and unwitnessed fall. Pain in bilateral shoulders, bilateral wrists, right hip and left knee. EXAM: RIGHT SHOULDER - 2+ VIEW COMPARISON:  Right shoulder radiograph 08/16/2017 FINDINGS: Nondisplaced right distal clavicle fracture is chronic and was retrospectively seen on prior exam. There sclerotic margins without significant callus  formation. No acute fracture. No dislocation. Mild superior subluxation of the humeral head suggests rotator cuff arthropathy. IMPRESSION: No acute fracture or subluxation of the right shoulder. Right distal clavicle fracture is chronic. Electronically Signed   By: Rubye Oaks M.D.   On: 11/04/2017 23:36   Dg Wrist Complete Left  Result Date: 11/04/2017 CLINICAL DATA:  Trip and unwitnessed fall. Pain in bilateral shoulders, bilateral wrists, right hip and left knee. EXAM: LEFT WRIST - COMPLETE 3+ VIEW COMPARISON:  None. FINDINGS: Chronic deformity of the wrist with cystic changes in the carpal bones, abnormal appearance of the scaphoid and multifocal osseous erosions. Well-defined erosions of the distal radius and distal ulna, some of which has sclerotic margins. Erosions at the distal radioulnar joint. 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 Soft tissue prominence noted about the dorsal wrist. IMPRESSION: 1. Chronic wrist changes suggesting inflammatory arthropathy, particularly rheumatoid arthritis. 2. No evidence of acute fracture. 3. Focal soft tissue prominence about the dorsal wrist may be hematoma in the setting of injury or be chronic. Electronically Signed   By: Rubye Oaks M.D.   On: 11/04/2017 23:38   Dg Wrist Complete  Right  Result Date: 11/04/2017 CLINICAL DATA:  Trip and unwitnessed fall. Pain in bilateral shoulders, bilateral wrists, right hip and left knee. EXAM: RIGHT WRIST - COMPLETE 3+ VIEW COMPARISON:  None. FINDINGS: Chronic deformity of the wrist with cystic changes of the carpal bones, osseous remottling of the radiocarpal and ulnar carpal joints, and osseous erosions of the ulna styloid. Radial subluxation at the thumb carpal metacarpal joint with chronic change at the thumb metacarpal phalangeal joint. No evidence of acute fracture. Mild soft tissue prominence about the dorsal wrist. IMPRESSION: 1. 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 2. No evidence of acute fracture. 3. Mild dorsal soft tissue prominence may be chronic or represent edema/hematoma in the setting of injury. Electronically Signed   By: Rubye Oaks M.D.   On: 11/04/2017 23:40   Ct Head Wo Contrast  Result Date: 11/04/2017 CLINICAL DATA:  Unwitnessed fall. Patient hit head and presents with headache and right-sided neck pain. EXAM: CT HEAD WITHOUT CONTRAST CT CERVICAL SPINE WITHOUT CONTRAST TECHNIQUE: Multidetector CT imaging of the head and cervical spine was performed following the standard protocol without intravenous contrast. Multiplanar CT image reconstructions of the cervical spine were also generated. COMPARISON:  None. FINDINGS: CT HEAD FINDINGS BRAIN: There is sulcal and ventricular prominence consistent with superficial and central atrophy. No intraparenchymal hemorrhage, mass effect nor midline shift. Periventricular and subcortical white matter hypodensities consistent with chronic small vessel ischemic disease are identified. No acute large vascular territory infarcts. No abnormal extra-axial fluid collections. Basal cisterns are not effaced and midline. VASCULAR: Mild-to-moderate calcific atherosclerosis of the carotid siphons. No hyperdense vessel sign. SKULL: No skull fracture. No significant scalp soft tissue swelling. SINUSES/ORBITS: Significant mucosal opacification of both maxillary sinuses with desiccated mucus. Opacification of the left sphenoid sinus and anterior ethmoid sinuses as well as much of the frontal sinus. Thickened maxillary sinus walls compatible with changes of chronic sinusitis. Partial opacification of the right mastoid. Clear left mastoids. Intact orbits. OTHER: None. CT CERVICAL SPINE FINDINGS Alignment: Marked settling of the craniocervical juncture with abutment of the odontoid process with the clivus. Likewise, settling of the C1 ring is noted. Retrolisthesis grade 1 of C3 on C4 is identified.  Skull base and vertebrae: No acute fracture. No suspicious osseous lesions. Soft tissues and spinal canal: No prevertebral soft tissue swelling. No intraspinal hematoma. Disc levels: Moderate disc flattening at C3-4, C6-7 and C7-T1. No significant central or neural foraminal encroachment. No jumped or perched facets. Multilevel mild degenerative facet arthropathy. Osteoarthritis involving the right lateral mass of C1 with C2. Uncovertebral joint osteoarthritis at C3-4 bilaterally. Upper chest: Apical pleuroparenchymal scarring bilaterally. Other: None IMPRESSION: 1. Chronic pansinusitis. 2. Cerebral atrophy with chronic small vessel ischemia. No acute intracranial abnormality. 3. Grade 1 retrolisthesis of C3 on C4 likely degenerative in etiology. 4. Settling of the skull base resulting with changes of abutment on the odontoid from the clivus and slight remodeled appearance of the odontoid tip. Settling of the atlas relative to the odontoid is also noted. Findings can be seen in the setting of inflammatory arthropathy. 5. 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 6. No acute cervical spine fracture. Electronically Signed   By: Tollie Eth M.D.   On: 11/04/2017 22:58   Ct Cervical Spine Wo Contrast  Result Date: 11/04/2017 CLINICAL DATA:  Unwitnessed fall. Patient hit head and presents with headache and right-sided neck pain. EXAM: CT HEAD WITHOUT CONTRAST CT CERVICAL SPINE WITHOUT CONTRAST TECHNIQUE: Multidetector CT imaging  of the head and cervical spine was performed following the standard protocol without intravenous contrast. Multiplanar CT image reconstructions of the cervical spine were also generated. COMPARISON:  None. FINDINGS: CT HEAD FINDINGS BRAIN: There is sulcal and ventricular prominence consistent with superficial and central atrophy. No intraparenchymal hemorrhage, mass effect nor midline shift. Periventricular and subcortical white matter hypodensities consistent with chronic small vessel  ischemic disease are identified. No acute large vascular territory infarcts. No abnormal extra-axial fluid collections. Basal cisterns are not effaced and midline. VASCULAR: Mild-to-moderate calcific atherosclerosis of the carotid siphons. No hyperdense vessel sign. SKULL: No skull fracture. No significant scalp soft tissue swelling. SINUSES/ORBITS: Significant mucosal opacification of both maxillary sinuses with desiccated mucus. Opacification of the left sphenoid sinus and anterior ethmoid sinuses as well as much of the frontal sinus. Thickened maxillary sinus walls compatible with changes of chronic sinusitis. Partial opacification of the right mastoid. Clear left mastoids. Intact orbits. OTHER: None. CT CERVICAL SPINE FINDINGS Alignment: Marked settling of the craniocervical juncture with abutment of the odontoid process with the clivus. Likewise, settling of the C1 ring is noted. Retrolisthesis grade 1 of C3 on C4 is identified. Skull base and vertebrae: No acute fracture. No suspicious osseous lesions. Soft tissues and spinal canal: No prevertebral soft tissue swelling. No intraspinal hematoma. Disc levels: Moderate disc flattening at C3-4, C6-7 and C7-T1. No significant central or neural foraminal encroachment. No jumped or perched facets. Multilevel mild degenerative facet arthropathy. Osteoarthritis involving the right lateral mass of C1 with C2. Uncovertebral joint osteoarthritis at C3-4 bilaterally. Upper chest: Apical pleuroparenchymal scarring bilaterally. Other: None IMPRESSION: 1. Chronic pansinusitis. 2. Cerebral atrophy with chronic small vessel ischemia. No acute intracranial abnormality. 3. Grade 1 retrolisthesis of C3 on C4 likely degenerative in etiology. 4. Settling of the skull base resulting with changes of abutment on the odontoid from the clivus and slight remodeled appearance of the odontoid tip. Settling of the atlas relative to the odontoid is also noted. Findings can be seen in the  setting of inflammatory arthropathy. 5. 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 6. No acute cervical spine fracture. Electronically Signed   By: Tollie Eth M.D.   On: 11/04/2017 22:58   Dg Shoulder Left  Result Date: 11/04/2017 CLINICAL DATA:  Trip and unwitnessed fall. Pain in bilateral shoulders, bilateral wrists, right hip and left knee. EXAM: LEFT SHOULDER - 2+ VIEW COMPARISON:  None. FINDINGS: There is no evidence of fracture or dislocation. Age related degenerative change of the acromioclavicular joint. Soft tissues are unremarkable. IMPRESSION: No fracture or dislocation of the left shoulder. Electronically Signed   By: Rubye Oaks M.D.   On: 11/04/2017 23:36   Dg Knee Complete 4 Views Left  Result Date: 11/04/2017 CLINICAL DATA:  Trip and unwitnessed fall. Pain in bilateral shoulders, bilateral wrists, right hip and left knee. 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 No periprosthetic fracture. Lucency surrounds the femoral stem measuring up to 2 mm, unchanged. There has been patellar resurfacing. Ossific and soft tissue densities posteriorly and laterally in the region of the joint capsule are chronic. Possible small joint effusion. IMPRESSION: 1. 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 2. Mild lateral orientation of the femoral stem with 2 mm surrounding lucency, can be seen with early loosening. 3. Possible joint effusion. Electronically Signed   By: Lujean Rave.D.  On: 11/04/2017 23:44   Dg Hip Unilat W Or Wo Pelvis 2-3 Views Right  Result Date: 11/04/2017 CLINICAL DATA:  Trip and unwitnessed fall. Pain in bilateral shoulders, bilateral wrists, right hip and left knee. EXAM: DG HIP (WITH OR WITHOUT PELVIS) 2-3V RIGHT COMPARISON:  None. FINDINGS: The cortical margins of the  bony pelvis and right hip are intact. No fracture. Pubic symphysis and sacroiliac joints are congruent. Both femoral heads are well-seated in the respective acetabula. Bones are under mineralized. IMPRESSION: No fracture of the pelvis or right hip. Electronically Signed   By: Rubye Oaks M.D.   On: 11/04/2017 23:41    Procedures Procedures (including critical care time)  Medications Ordered in ED Medications - No data to display   Initial Impression / Assessment and Plan / ED Course  I have reviewed the triage vital signs and the nursing notes.  Pertinent labs & imaging results that were available during my care of the patient were reviewed by me and considered in my medical decision making (see chart for details).     CHRISTA FASIG is a 77 y.o. female with a past medical history significant for asthma who presents with a fall.  Patient was brought in by EMS after having an unwitnessed fall at facility.  Patient reports that she was walking behind her bed and tripped.  She remembers the fall but does report that she lost consciousness.  She reports pain in her right hip, left knee, bilateral wrists and shoulders, and her neck.  She reports moderate headache.  She denies nausea, vomiting, vision changes.  She denies shortness of breath, palpitations, or chest pain.  She denies any abdominal pain.  She reports the pain is moderate all over.  She denies any preceding symptoms physically no fevers, chills, chest pain, shortness breath, nausea vomiting, conservation, diarrhea, dysuria.  She denies recent infections.  She reports feeling well before the fall this evening.    On exam, patient had no lacerations or abrasions seen.  Patient had tenderness diffusely on her neck as well as on her bilateral shoulders and wrist.  Patient had no snuffbox tenderness bilaterally.  Normal chest exam with clear lungs and nontender chest or back.  Abdomen nontender.  Patient had tenderness in the left knee  and right hip.  Patient had symmetric pulses in lower extremities and had normal sensation in lower extremities.  Tripping in her room, do not feel patient needs other lab testing at this time.  Patient overall is feeling well.    Anticipate reassessment after imaging studies.  Patient had normal pulses in upper extremities.    Patient will have diagnostic imaging of the places that were injured.  She also get chest x-ray head CT and neck CT.  Given lack of preceding symptoms and this being a mechanical fall, do not feel patient needs further laboratory testing or imaging workup at this time.    All diagnostic imaging did not show acute fractures.  No evidence of intracranial injury.  Patient may have a mild concussion causing the headache.  Given reassuring imaging patient was felt stable for reassessment and possible discharge.    Patient was able to admit to the bathroom without difficulty.  Patient reassured of findings and will follow up with PCP for further management.  Patient had no other questions or concerns and agreed with discharge.  Patient discharged in good condition.          Final Clinical Impressions(s) / ED Diagnoses  Final diagnoses:  Fall, initial encounter    ED Discharge Orders    None      Clinical Impression: 1. Fall, initial encounter     Disposition: Discharge  Condition: Good  I have discussed the results, Dx and Tx plan with the pt(& family if present). He/she/they expressed understanding and agree(s) with the plan. Discharge instructions discussed at great length. Strict return precautions discussed and pt &/or family have verbalized understanding of the instructions. No further questions at time of discharge.    New Prescriptions   No medications on file    Follow Up: [facility] [address] [place] [address] [phone] Schedule an appointment as soon as possible for a visit         Aadi Bordner, Canary Brim, MD 11/05/17 0004

## 2017-11-04 NOTE — ED Triage Notes (Signed)
Patient BIB [facility] for fall. Pt tripped and fell unwitnessed at facility around 2015. Pt denies hitting her head and is not on bld thinners. C/o lt knee swelling, rt hip pain and rt sided neck pain. Pt a/o x4 and normally ambulates with a walker.

## 2017-11-04 NOTE — ED Notes (Signed)
[facility] Expected date:  Expected time:  Means of arrival:  Comments: EMS female fall/hip and knee pain-knee swelling-no rotation to extremity-pain left side of neck

## 2017-11-05 NOTE — Discharge Instructions (Signed)
Your imaging today showed no evidence of new fractures or dislocations.  The CT of your head and neck did not show any fracture or intracranial bleeding.  You do have some degenerative disease present.  He also may have a concussion causing the headache.  Your chest x-ray, bilateral shoulder x-rays and bilateral wrist x-rays were also reassuring.  Your hip x-ray did not show fracture or dislocation of her hip and her knee x-ray did not show acute fracture of your left knee.  Please follow-up with your primary doctor for further evaluation management.  If any symptoms change or worsen, please return to the nearest emergency department.

## 2017-11-05 NOTE — ED Notes (Signed)
[facility] called for transportation  

## 2019-03-17 DIAGNOSIS — D649 Anemia, unspecified: Secondary | ICD-10-CM

## 2019-03-17 HISTORY — DX: Anemia, unspecified: D64.9

## 2019-04-15 ENCOUNTER — Emergency Department (HOSPITAL_COMMUNITY): Payer: Medicare Other

## 2019-04-15 ENCOUNTER — Inpatient Hospital Stay (HOSPITAL_COMMUNITY)
Admission: EM | Admit: 2019-04-15 | Discharge: 2019-04-17 | DRG: 811 | Disposition: A | Discharge status: Home Health | Payer: Medicare Other | Attending: Student in an Organized Health Care Education/Training Program | Admitting: Student in an Organized Health Care Education/Training Program

## 2019-04-15 ENCOUNTER — Other Ambulatory Visit: Payer: Self-pay

## 2019-04-15 DIAGNOSIS — M1990 Unspecified osteoarthritis, unspecified site: Secondary | ICD-10-CM | POA: Diagnosis present

## 2019-04-15 DIAGNOSIS — G9340 Encephalopathy, unspecified: Secondary | ICD-10-CM | POA: Diagnosis present

## 2019-04-15 DIAGNOSIS — J45909 Unspecified asthma, uncomplicated: Secondary | ICD-10-CM | POA: Diagnosis present

## 2019-04-15 DIAGNOSIS — N179 Acute kidney failure, unspecified: Secondary | ICD-10-CM | POA: Diagnosis present

## 2019-04-15 DIAGNOSIS — R410 Disorientation, unspecified: Secondary | ICD-10-CM

## 2019-04-15 DIAGNOSIS — J8410 Pulmonary fibrosis, unspecified: Secondary | ICD-10-CM | POA: Diagnosis present

## 2019-04-15 DIAGNOSIS — R4182 Altered mental status, unspecified: Secondary | ICD-10-CM | POA: Diagnosis not present

## 2019-04-15 DIAGNOSIS — R64 Cachexia: Secondary | ICD-10-CM | POA: Diagnosis present

## 2019-04-15 DIAGNOSIS — N183 Chronic kidney disease, stage 3 (moderate): Secondary | ICD-10-CM | POA: Diagnosis present

## 2019-04-15 DIAGNOSIS — E860 Dehydration: Secondary | ICD-10-CM | POA: Diagnosis present

## 2019-04-15 DIAGNOSIS — Z681 Body mass index (BMI) 19 or less, adult: Secondary | ICD-10-CM

## 2019-04-15 DIAGNOSIS — F0390 Unspecified dementia without behavioral disturbance: Secondary | ICD-10-CM | POA: Diagnosis present

## 2019-04-15 DIAGNOSIS — D649 Anemia, unspecified: Secondary | ICD-10-CM | POA: Diagnosis present

## 2019-04-15 DIAGNOSIS — D519 Vitamin B12 deficiency anemia, unspecified: Principal | ICD-10-CM | POA: Diagnosis present

## 2019-04-15 DIAGNOSIS — E43 Unspecified severe protein-calorie malnutrition: Secondary | ICD-10-CM | POA: Diagnosis present

## 2019-04-15 DIAGNOSIS — D638 Anemia in other chronic diseases classified elsewhere: Secondary | ICD-10-CM | POA: Diagnosis present

## 2019-04-15 DIAGNOSIS — Z20828 Contact with and (suspected) exposure to other viral communicable diseases: Secondary | ICD-10-CM | POA: Diagnosis present

## 2019-04-15 DIAGNOSIS — N189 Chronic kidney disease, unspecified: Secondary | ICD-10-CM | POA: Diagnosis present

## 2019-04-15 LAB — RETICULOCYTES
Immature Retic Fract: 11.6 % (ref 2.3–15.9)
RBC.: 2.3 MIL/uL — ABNORMAL LOW (ref 3.87–5.11)
Retic Count, Absolute: 20.9 10*3/uL (ref 19.0–186.0)
Retic Ct Pct: 0.9 % (ref 0.4–3.1)

## 2019-04-15 LAB — URINALYSIS, ROUTINE W REFLEX MICROSCOPIC
Bilirubin Urine: NEGATIVE
Glucose, UA: NEGATIVE mg/dL
Hgb urine dipstick: NEGATIVE
Ketones, ur: NEGATIVE mg/dL
Leukocytes,Ua: NEGATIVE
Nitrite: NEGATIVE
Protein, ur: NEGATIVE mg/dL
Specific Gravity, Urine: 1.016 (ref 1.005–1.030)
pH: 5 (ref 5.0–8.0)

## 2019-04-15 LAB — PREPARE RBC (CROSSMATCH)

## 2019-04-15 LAB — CBC WITH DIFFERENTIAL/PLATELET
Abs Immature Granulocytes: 0.04 10*3/uL (ref 0.00–0.07)
Basophils Absolute: 0 10*3/uL (ref 0.0–0.1)
Basophils Relative: 0 %
Eosinophils Absolute: 0 10*3/uL (ref 0.0–0.5)
Eosinophils Relative: 1 %
HCT: 21.7 % — ABNORMAL LOW (ref 36.0–46.0)
Hemoglobin: 6.6 g/dL — CL (ref 12.0–15.0)
Immature Granulocytes: 1 %
Lymphocytes Relative: 18 %
Lymphs Abs: 1.4 10*3/uL (ref 0.7–4.0)
MCH: 27.8 pg (ref 26.0–34.0)
MCHC: 30.4 g/dL (ref 30.0–36.0)
MCV: 91.6 fL (ref 80.0–100.0)
Monocytes Absolute: 0.6 10*3/uL (ref 0.1–1.0)
Monocytes Relative: 7 %
Neutro Abs: 5.8 10*3/uL (ref 1.7–7.7)
Neutrophils Relative %: 73 %
Platelets: 268 10*3/uL (ref 150–400)
RBC: 2.37 MIL/uL — ABNORMAL LOW (ref 3.87–5.11)
RDW: 17 % — ABNORMAL HIGH (ref 11.5–15.5)
WBC: 7.9 10*3/uL (ref 4.0–10.5)
nRBC: 0 % (ref 0.0–0.2)

## 2019-04-15 LAB — COMPREHENSIVE METABOLIC PANEL
ALT: 8 U/L (ref 0–44)
AST: 19 U/L (ref 15–41)
Albumin: 3.4 g/dL — ABNORMAL LOW (ref 3.5–5.0)
Alkaline Phosphatase: 56 U/L (ref 38–126)
Anion gap: 10 (ref 5–15)
BUN: 32 mg/dL — ABNORMAL HIGH (ref 8–23)
CO2: 20 mmol/L — ABNORMAL LOW (ref 22–32)
Calcium: 9.2 mg/dL (ref 8.9–10.3)
Chloride: 106 mmol/L (ref 98–111)
Creatinine, Ser: 1.67 mg/dL — ABNORMAL HIGH (ref 0.44–1.00)
GFR calc Af Amer: 33 mL/min — ABNORMAL LOW (ref >60)
GFR calc non Af Amer: 29 mL/min — ABNORMAL LOW (ref >60)
Glucose, Bld: 119 mg/dL — ABNORMAL HIGH (ref 70–99)
Potassium: 4.4 mmol/L (ref 3.5–5.1)
Sodium: 136 mmol/L (ref 135–145)
Total Bilirubin: 0.6 mg/dL (ref 0.3–1.2)
Total Protein: 8.2 g/dL — ABNORMAL HIGH (ref 6.5–8.1)

## 2019-04-15 LAB — POC OCCULT BLOOD, ED: Fecal Occult Bld: NEGATIVE

## 2019-04-15 LAB — LACTIC ACID, PLASMA: Lactic Acid, Venous: 1.3 mmol/L (ref 0.5–1.9)

## 2019-04-15 LAB — TROPONIN I (HIGH SENSITIVITY): Troponin I (High Sensitivity): 4 ng/L (ref <18)

## 2019-04-15 MED ORDER — MIRTAZAPINE 15 MG PO TABS
15.0000 mg | ORAL_TABLET | Freq: Every day | ORAL | Status: DC
  Administered 2019-04-16: 15 mg via ORAL
  Filled 2019-04-15 (×2): qty 1

## 2019-04-15 MED ORDER — HEPARIN SODIUM (PORCINE) 5000 UNIT/ML IJ SOLN
5000.0000 [IU] | Freq: Three times a day (TID) | INTRAMUSCULAR | Status: DC
  Administered 2019-04-16 – 2019-04-17 (×3): 5000 [IU] via SUBCUTANEOUS
  Filled 2019-04-15 (×3): qty 1

## 2019-04-15 MED ORDER — SENNOSIDES-DOCUSATE SODIUM 8.6-50 MG PO TABS: 1.0000 | ORAL_TABLET | Freq: Every evening | ORAL | Status: DC | PRN

## 2019-04-15 MED ORDER — ACETAMINOPHEN 325 MG PO TABS: 650.0000 mg | ORAL_TABLET | Freq: Four times a day (QID) | ORAL | Status: DC | PRN

## 2019-04-15 MED ORDER — SODIUM CHLORIDE 0.9 % IV SOLN
10.0000 mL/h | Freq: Once | INTRAVENOUS | Status: AC
  Administered 2019-04-16: 10 mL/h via INTRAVENOUS

## 2019-04-15 MED ORDER — SODIUM CHLORIDE 0.9 % IV BOLUS
500.0000 mL | Freq: Once | INTRAVENOUS | Status: AC
  Administered 2019-04-15: 22:00:00 500 mL via INTRAVENOUS

## 2019-04-15 MED ORDER — MOMETASONE FURO-FORMOTEROL FUM 200-5 MCG/ACT IN AERO
2.0000 | INHALATION_SPRAY | Freq: Two times a day (BID) | RESPIRATORY_TRACT | Status: DC
  Administered 2019-04-16 – 2019-04-17 (×3): 2 via RESPIRATORY_TRACT
  Filled 2019-04-15: qty 8.8

## 2019-04-15 MED ORDER — VITAMIN D 25 MCG (1000 UNIT) PO TABS: 2000.0000 [IU] | ORAL_TABLET | ORAL | Status: DC

## 2019-04-15 MED ORDER — PANTOPRAZOLE SODIUM 40 MG PO TBEC
40.0000 mg | DELAYED_RELEASE_TABLET | Freq: Every day | ORAL | Status: DC
  Administered 2019-04-16 – 2019-04-17 (×2): 40 mg via ORAL
  Filled 2019-04-15 (×2): qty 1

## 2019-04-15 MED ORDER — PROMETHAZINE HCL 25 MG PO TABS: 12.5000 mg | ORAL_TABLET | Freq: Four times a day (QID) | ORAL | Status: DC | PRN

## 2019-04-15 MED ORDER — ACETAMINOPHEN 650 MG RE SUPP: 650.0000 mg | Freq: Four times a day (QID) | RECTAL | Status: DC | PRN

## 2019-04-15 NOTE — ED Triage Notes (Addendum)
Patient arrived from [facility]. Call to EMS was for patient confusion. Per EMS pt has been A&O x 4. Patient temp was 100.8 so EMS gave 1000mg  Tylenol.

## 2019-04-15 NOTE — ED Provider Notes (Signed)
Emergency Department Provider Note   I have reviewed the triage vital signs and the nursing notes.   HISTORY  Chief Complaint Altered Mental Status   HPI Mary Villa is a 78 y.o. female with PMH of arthritis, anemia requiring PRBC transfusion, and arthritis presents to the ED was generalized weakness and confusion. Patient is a resident at [facility].  According to EMS she is typically alert and oriented x4.  They reported a temp on scene of 100.8 F and gave Tylenol prior to ED arrival.  Unclear last seen normal time.  Patient denies any headache, chest pain, or abdominal pain.  She reports some cough and shaking type symptoms.   Level 5 caveat: AMS   Past Medical History:  Diagnosis Date   Arthritis    Asthma     Patient Active Problem List   Diagnosis Date Noted   Anemia 08/11/2017    Past Surgical History:  Procedure Laterality Date   KNEE ARTHROSCOPY      Allergies Penicillins  No family history on file.  Social History Social History   Tobacco Use   Smoking status: Never Smoker   Smokeless tobacco: Never Used  Substance Use Topics   Alcohol use: Not on file   Drug use: Not on file    Review of Systems  Constitutional: No fever/chills. Positive generalized weakness.  Eyes: No visual changes. ENT: No sore throat. Cardiovascular: Denies chest pain. Respiratory: Denies shortness of breath. Gastrointestinal: No abdominal pain.  No nausea, no vomiting.  No diarrhea.  No constipation. Genitourinary: Negative for dysuria. Musculoskeletal: Negative for back pain. Skin: Negative for rash. Neurological: Negative for headaches, focal weakness or numbness.  10-point ROS otherwise negative.  ____________________________________________   PHYSICAL EXAM:  VITAL SIGNS: ED Triage Vitals  Enc Vitals Group     BP 04/15/19 2104 (!) 125/91     Pulse Rate 04/15/19 2104 93     Resp 04/15/19 2104 (!) 22     Temp 04/15/19 2104 99.3 F (37.4  C)     Temp Source 04/15/19 2104 Oral     SpO2 04/15/19 2100 100 %   Constitutional: Alert but slow to respond. Oriented to self only. Well appearing and in no acute distress. Eyes: Conjunctivae are normal. Head: Atraumatic. Nose: No congestion/rhinnorhea. Mouth/Throat: Mucous membranes are moist. Neck: No stridor.   Cardiovascular: Normal rate, regular rhythm. Good peripheral circulation. Grossly normal heart sounds.   Respiratory: Normal respiratory effort.  No retractions. Lungs CTAB. Gastrointestinal: Soft and nontender. No distention. Rectal exam with nurse chaperone. Light brown stool. No gross blood or melena. Hemoccult negative.  Musculoskeletal: No gross deformities of extremities. No LE edema.  Neurologic:  Normal speech and language. No gross focal neurologic deficits are appreciated.  Skin:  Skin is warm, dry and intact. No rash noted.  ____________________________________________   LABS (all labs ordered are listed, but only abnormal results are displayed)  Labs Reviewed  COMPREHENSIVE METABOLIC PANEL - Abnormal; Notable for the following components:      Result Value   CO2 20 (*)    Glucose, Bld 119 (*)    BUN 32 (*)    Creatinine, Ser 1.67 (*)    Total Protein 8.2 (*)    Albumin 3.4 (*)    GFR calc non Af Amer 29 (*)    GFR calc Af Amer 33 (*)    All other components within normal limits  CBC WITH DIFFERENTIAL/PLATELET - Abnormal; Notable for the following components:  RBC 2.37 (*)    Hemoglobin 6.6 (*)    HCT 21.7 (*)    RDW 17.0 (*)    All other components within normal limits  URINALYSIS, ROUTINE W REFLEX MICROSCOPIC - Abnormal; Notable for the following components:   APPearance HAZY (*)    All other components within normal limits  CULTURE, BLOOD (ROUTINE X 2)  CULTURE, BLOOD (ROUTINE X 2)  URINE CULTURE  SARS CORONAVIRUS 2 (HOSPITAL ORDER, PERFORMED IN [facility])  LACTIC ACID, PLASMA  LACTIC ACID, PLASMA  VITAMIN B12  FOLATE    IRON AND TIBC  FERRITIN  RETICULOCYTES  POC OCCULT BLOOD, ED  TYPE AND SCREEN  PREPARE RBC (CROSSMATCH)  TROPONIN I (HIGH SENSITIVITY)  TROPONIN I (HIGH SENSITIVITY)   ____________________________________________  RADIOLOGY  Dg Chest Portable 1 View  Result Date: 04/15/2019 CLINICAL DATA:  Fever and altered mental status. EXAM: PORTABLE CHEST 1 VIEW COMPARISON:  November 04, 2017 FINDINGS: There is extensive pleuroparenchymal scarring at the lung apices with scattered areas of scarring in likely calcified granulomas bilaterally. Heart size is stable from prior study. There is no pneumothorax. No large pleural effusion. There is stable blunting of the right costophrenic angle. There is no acute osseous abnormality. There is some mild elevation of the left hemidiaphragm which is similar to prior study. Aortic calcifications are noted. IMPRESSION: No active disease. Electronically Signed   By: Katherine Mantlehristopher  Green M.D.   On: 04/15/2019 21:40    ____________________________________________   PROCEDURES  Procedure(s) performed:   Procedures  CRITICAL CARE Performed by: Maia PlanJoshua G Tomoya Ringwald Total critical care time: 35 minutes Critical care time was exclusive of separately billable procedures and treating other patients. Critical care was necessary to treat or prevent imminent or life-threatening deterioration. Critical care was time spent personally by me on the following activities: development of treatment plan with patient and/or surrogate as well as nursing, discussions with consultants, evaluation of patient's response to treatment, examination of patient, obtaining history from patient or surrogate, ordering and performing treatments and interventions, ordering and review of laboratory studies, ordering and review of radiographic studies, pulse oximetry and re-evaluation of patient's condition.  Alona BeneJoshua Aadhira Heffernan, MD Emergency Medicine  ____________________________________________   INITIAL  IMPRESSION / ASSESSMENT AND PLAN / ED COURSE  Pertinent labs & imaging results that were available during my care of the patient were reviewed by me and considered in my medical decision making (see chart for details).   Patient presents to the emergency department with generalized weakness and some confusion.  She is able to provide a reasonable history on my initial evaluation.  No clear source for infection identified by history or on exam.  Lab work with no clear source for infection.  She does have anemia with hemoglobin of 6.6.  MCV is normal.  COVID test is pending.  Chest x-ray reviewed with no acute findings.  Attempted to reach family regarding the patient's admission but they are not answering the phone after multiple calls.  Plan for admit.   Discussed patient's case with Medicine Service to request admission. Patient and family (if present) updated with plan. Care transferred to Medicine service.  I reviewed all nursing notes, vitals, pertinent old records, EKGs, labs, imaging (as available).  ____________________________________________  FINAL CLINICAL IMPRESSION(S) / ED DIAGNOSES  Final diagnoses:  Symptomatic anemia  Disorientation    MEDICATIONS GIVEN DURING THIS VISIT:  Medications  0.9 %  sodium chloride infusion (has no administration in time range)  mometasone-formoterol (DULERA) 200-5 MCG/ACT  inhaler 2 puff (has no administration in time range)  mirtazapine (REMERON) tablet 15 mg (has no administration in time range)  pantoprazole (PROTONIX) EC tablet 40 mg (has no administration in time range)  Vitamin D3 TABS 2,000 Units (has no administration in time range)  sodium chloride 0.9 % bolus 500 mL (500 mLs Intravenous New Bag/Given 04/15/19 2217)    Note:  This document was prepared using Dragon voice recognition software and may include unintentional dictation errors.  Nanda Quinton, MD Emergency Medicine    Else Habermann, W[facility], MD 04/15/19 [phone]

## 2019-04-15 NOTE — ED Notes (Signed)
EDP notified of Hgb of 6.6

## 2019-04-16 ENCOUNTER — Encounter (HOSPITAL_COMMUNITY): Payer: Self-pay | Admitting: General Practice

## 2019-04-16 DIAGNOSIS — D519 Vitamin B12 deficiency anemia, unspecified: Secondary | ICD-10-CM | POA: Diagnosis present

## 2019-04-16 DIAGNOSIS — Z681 Body mass index (BMI) 19 or less, adult: Secondary | ICD-10-CM | POA: Diagnosis not present

## 2019-04-16 DIAGNOSIS — N183 Chronic kidney disease, stage 3 (moderate): Secondary | ICD-10-CM | POA: Diagnosis present

## 2019-04-16 DIAGNOSIS — N189 Chronic kidney disease, unspecified: Secondary | ICD-10-CM | POA: Diagnosis present

## 2019-04-16 DIAGNOSIS — N179 Acute kidney failure, unspecified: Secondary | ICD-10-CM | POA: Diagnosis present

## 2019-04-16 DIAGNOSIS — Z7951 Long term (current) use of inhaled steroids: Secondary | ICD-10-CM

## 2019-04-16 DIAGNOSIS — Z20828 Contact with and (suspected) exposure to other viral communicable diseases: Secondary | ICD-10-CM | POA: Diagnosis present

## 2019-04-16 DIAGNOSIS — D649 Anemia, unspecified: Secondary | ICD-10-CM | POA: Diagnosis not present

## 2019-04-16 DIAGNOSIS — E538 Deficiency of other specified B group vitamins: Secondary | ICD-10-CM

## 2019-04-16 DIAGNOSIS — M1990 Unspecified osteoarthritis, unspecified site: Secondary | ICD-10-CM | POA: Diagnosis present

## 2019-04-16 DIAGNOSIS — Z880 Allergy status to penicillin: Secondary | ICD-10-CM

## 2019-04-16 DIAGNOSIS — J8410 Pulmonary fibrosis, unspecified: Secondary | ICD-10-CM | POA: Diagnosis present

## 2019-04-16 DIAGNOSIS — D631 Anemia in chronic kidney disease: Secondary | ICD-10-CM

## 2019-04-16 DIAGNOSIS — R4182 Altered mental status, unspecified: Secondary | ICD-10-CM

## 2019-04-16 DIAGNOSIS — R64 Cachexia: Secondary | ICD-10-CM | POA: Diagnosis present

## 2019-04-16 DIAGNOSIS — J45909 Unspecified asthma, uncomplicated: Secondary | ICD-10-CM | POA: Diagnosis present

## 2019-04-16 DIAGNOSIS — E860 Dehydration: Secondary | ICD-10-CM | POA: Diagnosis present

## 2019-04-16 DIAGNOSIS — G9340 Encephalopathy, unspecified: Secondary | ICD-10-CM | POA: Diagnosis present

## 2019-04-16 DIAGNOSIS — D638 Anemia in other chronic diseases classified elsewhere: Secondary | ICD-10-CM | POA: Diagnosis present

## 2019-04-16 DIAGNOSIS — R509 Fever, unspecified: Secondary | ICD-10-CM

## 2019-04-16 DIAGNOSIS — E43 Unspecified severe protein-calorie malnutrition: Secondary | ICD-10-CM | POA: Diagnosis present

## 2019-04-16 LAB — LACTATE DEHYDROGENASE: LDH: 188 U/L (ref 98–192)

## 2019-04-16 LAB — BASIC METABOLIC PANEL
Anion gap: 8 (ref 5–15)
BUN: 29 mg/dL — ABNORMAL HIGH (ref 8–23)
CO2: 20 mmol/L — ABNORMAL LOW (ref 22–32)
Calcium: 8.7 mg/dL — ABNORMAL LOW (ref 8.9–10.3)
Chloride: 111 mmol/L (ref 98–111)
Creatinine, Ser: 1.61 mg/dL — ABNORMAL HIGH (ref 0.44–1.00)
GFR calc Af Amer: 35 mL/min — ABNORMAL LOW (ref >60)
GFR calc non Af Amer: 30 mL/min — ABNORMAL LOW (ref >60)
Glucose, Bld: 80 mg/dL (ref 70–99)
Potassium: 4 mmol/L (ref 3.5–5.1)
Sodium: 139 mmol/L (ref 135–145)

## 2019-04-16 LAB — IRON AND TIBC
Iron: 19 ug/dL — ABNORMAL LOW (ref 28–170)
Saturation Ratios: 11 % (ref 10.4–31.8)
TIBC: 174 ug/dL — ABNORMAL LOW (ref 250–450)
UIBC: 155 ug/dL

## 2019-04-16 LAB — VITAMIN B12: Vitamin B-12: <50 pg/mL — ABNORMAL LOW (ref 180–914)

## 2019-04-16 LAB — CBC
HCT: 28.3 % — ABNORMAL LOW (ref 36.0–46.0)
Hemoglobin: 9 g/dL — ABNORMAL LOW (ref 12.0–15.0)
MCH: 28.5 pg (ref 26.0–34.0)
MCHC: 31.8 g/dL (ref 30.0–36.0)
MCV: 89.6 fL (ref 80.0–100.0)
Platelets: 228 10*3/uL (ref 150–400)
RBC: 3.16 MIL/uL — ABNORMAL LOW (ref 3.87–5.11)
RDW: 15.7 % — ABNORMAL HIGH (ref 11.5–15.5)
WBC: 3.8 10*3/uL — ABNORMAL LOW (ref 4.0–10.5)
nRBC: 0 % (ref 0.0–0.2)

## 2019-04-16 LAB — URINE CULTURE: Culture: NO GROWTH

## 2019-04-16 LAB — FERRITIN: Ferritin: 507 ng/mL — ABNORMAL HIGH (ref 11–307)

## 2019-04-16 LAB — TROPONIN I (HIGH SENSITIVITY): Troponin I (High Sensitivity): 4 ng/L (ref <18)

## 2019-04-16 LAB — FOLATE: Folate: 22 ng/mL (ref >5.9)

## 2019-04-16 LAB — SARS CORONAVIRUS 2 BY RT PCR (HOSPITAL ORDER, PERFORMED IN ~~LOC~~ HOSPITAL LAB): SARS Coronavirus 2: NEGATIVE

## 2019-04-16 LAB — HEMOGLOBIN AND HEMATOCRIT, BLOOD
HCT: 31.1 % — ABNORMAL LOW (ref 36.0–46.0)
Hemoglobin: 10.3 g/dL — ABNORMAL LOW (ref 12.0–15.0)

## 2019-04-16 LAB — SAVE SMEAR(SSMR), FOR PROVIDER SLIDE REVIEW

## 2019-04-16 LAB — LACTIC ACID, PLASMA: Lactic Acid, Venous: 1.4 mmol/L (ref 0.5–1.9)

## 2019-04-16 MED ORDER — ADULT MULTIVITAMIN W/MINERALS CH
1.0000 | ORAL_TABLET | Freq: Every day | ORAL | Status: DC
  Administered 2019-04-16 – 2019-04-17 (×2): 1 via ORAL
  Filled 2019-04-16 (×2): qty 1

## 2019-04-16 MED ORDER — CYANOCOBALAMIN 1000 MCG/ML IJ SOLN
1000.0000 ug | INTRAMUSCULAR | Status: DC
  Administered 2019-04-16: 1000 ug via INTRAMUSCULAR
  Filled 2019-04-16: qty 1

## 2019-04-16 MED ORDER — ENSURE ENLIVE PO LIQD: 237.0000 mL | Freq: Two times a day (BID) | ORAL | Status: DC

## 2019-04-16 NOTE — ED Notes (Signed)
ED TO INPATIENT HANDOFF REPORT  ED Nurse Name and Phone #: Kiaja Shorty [phone]  S Name/Age/Gender Mary Villa 78 y.o. female Room/Bed: 034C/034C  Code Status   Code Status: Full Code  Home/SNF/Other Nursing Home Patient oriented to: self Is this baseline? Yes   Triage Complete: Triage complete  Chief Complaint Fever, Confusion  Triage Note Patient arrived from [facility]. Call to EMS was for patient confusion. Per EMS pt has been A&O x 4. Patient temp was 100.8 so EMS gave 1000mg  Tylenol.   Allergies Allergies  Allergen Reactions  . Penicillins Hives    Has patient had a PCN reaction causing immediate rash, facial/tongue/throat swelling, SOB or lightheadedness with hypotension: Yes Has patient had a PCN reaction causing severe rash involving mucus membranes or skin necrosis: No Has patient had a PCN reaction that required hospitalization: Unknown Has patient had a PCN reaction occurring within the last 10 years: Unknown If all of the above answers are "NO", then may proceed with Cephalosporin use.     Level of Care/Admitting Diagnosis ED Disposition    ED Disposition Condition Comment   Admit  Hospital Area: [facility] [[id number]]  Level of Care: Telemetry Medical [104]  Covid Evaluation: Asymptomatic Screening Protocol (No Symptoms)  Diagnosis: Symptomatic anemia [[id number]]  Admitting Physician: Silvio Pate  Attending Physician: HOFFMAN, ERIK C [[id number]]  PT Class (Do Not Modify): Observation [104]  PT Acc Code (Do Not Modify): Observation [10022]       B Medical/Surgery History Past Medical History:  Diagnosis Date  . Arthritis   . Asthma    Past Surgical History:  Procedure Laterality Date  . KNEE ARTHROSCOPY       A IV Location/Drains/Wounds Patient Lines/Drains/Airways Status   Active Line/Drains/Airways    Name:   Placement date:   Placement time:   Site:   Days:   Peripheral IV 04/15/19 Right Antecubital   04/15/19     2330    Antecubital   1          Intake/Output Last 24 hours No intake or output data in the 24 hours ending 04/16/19 0207  Labs/Imaging Results for orders placed or performed during the hospital encounter of 04/15/19 (from the past 48 hour(s))  Comprehensive metabolic panel     Status: Abnormal   Collection Time: 04/15/19  9:25 PM  Result Value Ref Range   Sodium 136 135 - 145 mmol/L   Potassium 4.4 3.5 - 5.1 mmol/L   Chloride 106 98 - 111 mmol/L   CO2 20 (L) 22 - 32 mmol/L   Glucose, Bld 119 (H) 70 - 99 mg/dL   BUN 32 (H) 8 - 23 mg/dL   Creatinine, Ser 1.91 (H) 0.44 - 1.00 mg/dL   Calcium 9.2 8.9 - 47.8 mg/dL   Total Protein 8.2 (H) 6.5 - 8.1 g/dL   Albumin 3.4 (L) 3.5 - 5.0 g/dL   AST 19 15 - 41 U/L   ALT 8 0 - 44 U/L   Alkaline Phosphatase 56 38 - 126 U/L   Total Bilirubin 0.6 0.3 - 1.2 mg/dL   GFR calc non Af Amer 29 (L) >60 mL/min   GFR calc Af Amer 33 (L) >60 mL/min   Anion gap 10 5 - 15    Comment: Performed at [facility], [address] [place], [place] [address]  Troponin I (High Sensitivity)     Status: None   Collection Time: 04/15/19  9:25 PM  Result Value Ref Range   Troponin I (High Sensitivity) 4 <18 ng/L    Comment: (NOTE) Elevated high sensitivity troponin I (hsTnI) values and significant  changes across serial measurements may suggest ACS but many other  chronic and acute conditions are known to elevate hsTnI results.  Refer to the "Links" section for chest pain algorithms and additional  guidance. Performed at [facility], [address] [place], [place] [address]   CBC with Differential     Status: Abnormal   Collection Time: 04/15/19  9:25 PM  Result Value Ref Range   WBC 7.9 4.0 - 10.5 K/uL   RBC 2.37 (L) 3.87 - 5.11 MIL/uL   Hemoglobin 6.6 (LL) 12.0 - 15.0 g/dL    Comment: REPEATED TO VERIFY THIS CRITICAL RESULT HAS VERIFIED AND BEEN CALLED TO B.BAILIFF,RN BY MELISSA BROGDON ON 08 30 2020 AT 2205, AND HAS BEEN READ BACK.      HCT 21.7 (L) 36.0 - 46.0 %   MCV 91.6 80.0 - 100.0 fL   MCH 27.8 26.0 - 34.0 pg   MCHC 30.4 30.0 - 36.0 g/dL   RDW 19.117.0 (H) 47.811.5 - 29.515.5 %   Platelets 268 150 - 400 K/uL   nRBC 0.0 0.0 - 0.2 %   Neutrophils Relative % 73 %   Neutro Abs 5.8 1.7 - 7.7 K/uL   Lymphocytes Relative 18 %   Lymphs Abs 1.4 0.7 - 4.0 K/uL   Monocytes Relative 7 %   Monocytes Absolute 0.6 0.1 - 1.0 K/uL   Eosinophils Relative 1 %   Eosinophils Absolute 0.0 0.0 - 0.5 K/uL   Basophils Relative 0 %   Basophils Absolute 0.0 0.0 - 0.1 K/uL   Immature Granulocytes 1 %   Abs Immature Granulocytes 0.04 0.00 - 0.07 K/uL    Comment: Performed at [facility], [address] [place], [place] [address]  Lactic acid, plasma     Status: None   Collection Time: 04/15/19  9:28 PM  Result Value Ref Range   Lactic Acid, Venous 1.3 0.5 - 1.9 mmol/L    Comment: Performed at [facility], [address] [place], [place] [address]  Urinalysis, Routine w reflex microscopic     Status: Abnormal   Collection Time: 04/15/19 10:15 PM  Result Value Ref Range   Color, Urine YELLOW YELLOW   APPearance HAZY (A) CLEAR   Specific Gravity, Urine 1.016 1.005 - 1.030   pH 5.0 5.0 - 8.0   Glucose, UA NEGATIVE NEGATIVE mg/dL   Hgb urine dipstick NEGATIVE NEGATIVE   Bilirubin Urine NEGATIVE NEGATIVE   Ketones, ur NEGATIVE NEGATIVE mg/dL   Protein, ur NEGATIVE NEGATIVE mg/dL   Nitrite NEGATIVE NEGATIVE   Leukocytes,Ua NEGATIVE NEGATIVE    Comment: Performed at [facility], [address] [place], [place] [address]  Prepare RBC     Status: None   Collection Time: 04/15/19 10:17 PM  Result Value Ref Range   Order Confirmation      ORDER PROCESSED BY BLOOD BANK Performed at [facility], [address] [place], [place] [address]   Type and screen [facility]     Status: None (Preliminary result)   Collection Time: 04/15/19 10:25 PM  Result Value Ref Range   ABO/RH(D) A POS    Antibody  Screen NEG    Sample Expiration 04/18/2019,2359    Unit Number [id number]    Blood Component Type RED CELLS,LR  Unit division 00    Status of Unit ALLOCATED    Transfusion Status OK TO TRANSFUSE    Crossmatch Result Compatible    Unit Number [id number]    Blood Component Type RED CELLS,LR    Unit division 00    Status of Unit ISSUED    Transfusion Status OK TO TRANSFUSE    Crossmatch Result      Compatible Performed at [facility], [place] [address], [place], [place] [address]   Vitamin B12     Status: Abnormal   Collection Time: 04/15/19 10:38 PM  Result Value Ref Range   Vitamin B-12 <50 (L) 180 - 914 pg/mL    Comment: Performed at [facility], [place] [address], [place], [place] [address]  Folate     Status: None   Collection Time: 04/15/19 10:38 PM  Result Value Ref Range   Folate 22.0 >5.9 ng/mL    Comment: Performed at [facility], [place] [address] [place], [place] [address]  Iron and TIBC     Status: Abnormal   Collection Time: 04/15/19 10:38 PM  Result Value Ref Range   Iron 19 (L) 28 - 170 ug/dL   TIBC 174 (L) 250 - 450 ug/dL   Saturation Ratios 11 10.4 - 31.8 %   UIBC 155 ug/dL    Comment: Performed at [facility], [place] [address] [place], [place] [address]  Ferritin     Status: Abnormal   Collection Time: 04/15/19 10:38 PM  Result Value Ref Range   Ferritin 507 (H) 11 - 307 ng/mL    Comment: Performed at [facility], [place] [address], [place], [place] [address]  Reticulocytes     Status: Abnormal   Collection Time: 04/15/19 10:38 PM  Result Value Ref Range   Retic Ct Pct 0.9 0.4 - 3.1 %   RBC. 2.30 (L) 3.87 - 5.11 MIL/uL   Retic Count, Absolute 20.9 19.0 - 186.0 K/uL   Immature Retic Fract 11.6 2.3 - 15.9 %    Comment: Performed at [place] [address] [place], [place] [address]  POC occult blood, ED Provider will collect     Status: None   Collection Time: 04/15/19 10:40 PM  Result Value Ref Range   Fecal  Occult Bld NEGATIVE NEGATIVE  SARS Coronavirus 2 [facility] order, Performed in [facility] hospital lab) Nasopharyngeal Nasopharyngeal Swab     Status: None   Collection Time: 04/15/19 11:23 PM   Specimen: Nasopharyngeal Swab  Result Value Ref Range   SARS Coronavirus 2 NEGATIVE NEGATIVE    Comment: (NOTE) If result is NEGATIVE SARS-CoV-2 target nucleic acids are NOT DETECTED. The SARS-CoV-2 RNA is generally detectable in upper and lower  respiratory specimens during the acute phase of infection. The lowest  concentration of SARS-CoV-2 viral copies this assay can detect is 250  copies / mL. A negative result does not preclude SARS-CoV-2 infection  and should not be used as the sole basis for treatment or other  patient management decisions.  A negative result may occur with  improper specimen collection / handling, submission of specimen other  than nasopharyngeal swab, presence of viral mutation(s) within the  areas targeted by this assay, and inadequate number of viral copies  (<250 copies / mL). A negative result must be combined with clinical  observations, patient history, and epidemiological information. If result is POSITIVE SARS-CoV-2 target nucleic acids are DETECTED. The SARS-CoV-2 RNA is generally  detectable in upper and lower  respiratory specimens dur ing the acute phase of infection.  Positive  results are indicative of active infection with SARS-CoV-2.  Clinical  correlation with patient history and other diagnostic information is  necessary to determine patient infection status.  Positive results do  not rule out bacterial infection or co-infection with other viruses. If result is PRESUMPTIVE POSTIVE SARS-CoV-2 nucleic acids MAY BE PRESENT.   A presumptive positive result was obtained on the submitted specimen  and confirmed on repeat testing.  While 2019 novel coronavirus  (SARS-CoV-2) nucleic acids may be present in the submitted sample  additional confirmatory  testing may be necessary for epidemiological  and / or clinical management purposes  to differentiate between  SARS-CoV-2 and other Sarbecovirus currently known to infect humans.  If clinically indicated additional testing with an alternate test  methodology [phone]([id number]) is advised. The SARS-CoV-2 RNA is generally  detectable in upper and lower respiratory sp ecimens during the acute  phase of infection. The expected result is Negative. Fact Sheet for Patients:  BoilerBrush.com.cyhttps://www.fda.gov/media/136312/download Fact Sheet for Healthcare Providers: https://pope.com/https://www.fda.gov/media/136313/download This test is not yet approved or cleared by the [place] FDA and has been authorized for detection and/or diagnosis of SARS-CoV-2 by FDA under an Emergency Use Authorization (EUA).  This EUA will remain in effect (meaning this test can be used) for the duration of the COVID-19 declaration under Section 564(b)(1) of the Act, 21 U.S.C. section 360bbb-3(b)(1), unless the authorization is terminated or revoked sooner. Performed at [facility], [address] [place], [place] [address]   Troponin I (High Sensitivity)     Status: None   Collection Time: 04/15/19 11:25 PM  Result Value Ref Range   Troponin I (High Sensitivity) 4 <18 ng/L    Comment: (NOTE) Elevated high sensitivity troponin I (hsTnI) values and significant  changes across serial measurements may suggest ACS but many other  chronic and acute conditions are known to elevate hsTnI results.  Refer to the "Links" section for chest pain algorithms and additional  guidance. Performed at [facility], [address] [place], [place] [address]    Dg Chest Portable 1 View  Result Date: 04/15/2019 CLINICAL DATA:  Fever and altered mental status. EXAM: PORTABLE CHEST 1 VIEW COMPARISON:  November 04, 2017 FINDINGS: There is extensive pleuroparenchymal scarring at the lung apices with scattered areas of scarring in likely calcified granulomas  bilaterally. Heart size is stable from prior study. There is no pneumothorax. No large pleural effusion. There is stable blunting of the right costophrenic angle. There is no acute osseous abnormality. There is some mild elevation of the left hemidiaphragm which is similar to prior study. Aortic calcifications are noted. IMPRESSION: No active disease. Electronically Signed   By: Katherine Mantlehristopher  Green M.D.   On: 04/15/2019 21:40    Pending Labs Unresulted Labs (From admission, onward)    Start     Ordered   04/16/19 0500  Basic metabolic panel  Tomorrow morning,   R     04/15/19 2335   04/16/19 0500  CBC  Tomorrow morning,   R     04/15/19 2335   04/16/19 0120  Pathologist smear review  Once,   STAT     04/16/19 0119   04/15/19 2125  Lactic acid, plasma  Now then every 2 hours,   STAT     04/15/19 2125   04/15/19 2125  Culture, blood (routine x 2)  BLOOD CULTURE X 2,  STAT    Question:  Patient immune status  Answer:  Normal   04/15/19 2125   04/15/19 2125  Urine culture  ONCE - STAT,   STAT    Question:  Patient immune status  Answer:  Normal   04/15/19 2125          Vitals/Pain Today's Vitals   04/16/19 0100 04/16/19 0112 04/16/19 0143 04/16/19 0200  BP: (!) 89/48 (!) 89/48 (!) 122/57 (!) 107/53  Pulse: 65 64 71 61  Resp: 18 16 16 19   Temp:  97.6 F (36.4 C) (!) 97.5 F (36.4 C)   TempSrc:  Axillary Oral   SpO2: 100% 100% 100% 100%  PainSc:        Isolation Precautions No active isolations  Medications Medications  0.9 %  sodium chloride infusion (has no administration in time range)  mometasone-formoterol (DULERA) 200-5 MCG/ACT inhaler 2 puff (has no administration in time range)  mirtazapine (REMERON) tablet 15 mg (has no administration in time range)  pantoprazole (PROTONIX) EC tablet 40 mg (has no administration in time range)  cholecalciferol (VITAMIN D3) tablet 2,000 Units (has no administration in time range)  heparin injection 5,000 Units (has no administration  in time range)  acetaminophen (TYLENOL) tablet 650 mg (has no administration in time range)    Or  acetaminophen (TYLENOL) suppository 650 mg (has no administration in time range)  senna-docusate (Senokot-S) tablet 1 tablet (has no administration in time range)  promethazine (PHENERGAN) tablet 12.5 mg (has no administration in time range)  sodium chloride 0.9 % bolus 500 mL (500 mLs Intravenous New Bag/Given 04/15/19 2217)    Mobility walks with device High fall risk   Focused Assessments Pulmonary Assessment Handoff:  Lung sounds:   O2 Device: Room Air        R Recommendations: See Admitting Provider Note  Report given to:   Additional Notes: Covid neg, pt receiving first unit of PRBC

## 2019-04-16 NOTE — Progress Notes (Signed)
Initial Nutrition Assessment  DOCUMENTATION CODES:   Underweight, Severe malnutrition in context of chronic illness  INTERVENTION:   -MVI with minerals daily -Ensure Enlive po BID, each supplement provides 350 kcal and 20 grams of protein -Magic cup TID with meals, each supplement provides 290 kcal and 9 grams of protein -Feeding assistance with meals  NUTRITION DIAGNOSIS:   Severe Malnutrition related to chronic illness(asthma) as evidenced by moderate fat depletion, severe fat depletion, moderate muscle depletion, severe muscle depletion.  GOAL:   Patient will meet greater than or equal to 90% of their needs  MONITOR:   PO intake, Supplement acceptance, Labs, Weight trends, Skin, I & O's  REASON FOR ASSESSMENT:   Consult Assessment of nutrition requirement/status  ASSESSMENT:   This is an 78 year old female with a past medical history significant for asthma and arthritis who presented to the ED via EMS [facility] for 1 day history of altered mental status, fever, cough.  Patient was alert and oriented to person and time and did contribute to the history in addition to her son Nelma RothmanDaryl and staff from [facility].  Patient denied any acute pain, dysuria, diarrhea, or recent falls.  Upon speaking with the staff that the assisted living center, they noted that she had seemed more confused today including forgetting to use her walker.  They also noted that when they checked her temperature tonight, was found to be 104 F.  She did not receive Tylenol at that time.  They also mention that the patient's roommate had commented on the patient coughing a lot last night and having chills.  Pt admitted with symptomatic anemia.  Reviewed I/O's: +278 ml x 24 hours  UOP: 700 ml x 24 hours   Pt sitting up in bed, eating dinner roll off lunch tray at time of visit without difficulty. Pt pleasant, however, unable to provide significant history. She continues to reports "I'm feeling  better today" after RD's questions.   Per H&P, [facility]   Noted meal completion 15-50%. Given significant fat and muscle depletions, suspect poor oral intake at baseline, however, unable to confirm at this time.   Per wt hx, wt has been stable.   Pt would greatly benefit from addition of nutritional supplements. Pt with missing teeth, but did not have difficulty consuming dinner roll per RD observation. Recommend continue regular diet for widest variety of food selections.   Medications reviewed and include remeron and vitamin B-12.   Labs reviewed.   NUTRITION - FOCUSED PHYSICAL EXAM:    Most Recent Value  Orbital Region  Severe depletion  Upper Arm Region  Severe depletion  Thoracic and Lumbar Region  Severe depletion  Buccal Region  Moderate depletion  Temple Region  Severe depletion  Clavicle Bone Region  Severe depletion  Clavicle and Acromion Bone Region  Severe depletion  Scapular Bone Region  Severe depletion  Dorsal Hand  Moderate depletion  Patellar Region  Moderate depletion  Anterior Thigh Region  Moderate depletion  Posterior Calf Region  Moderate depletion  Edema (RD Assessment)  Mild  Hair  Reviewed  Eyes  Reviewed  Mouth  Reviewed  Skin  Reviewed  Nails  Reviewed       Diet Order:   Diet Order            Diet regular Room service appropriate? Yes; Fluid consistency: Thin  Diet effective now  EDUCATION NEEDS:   Not appropriate for education at this time  Skin:  Skin Assessment: Reviewed RN Assessment  Last BM:  Unknown  Height:   Ht Readings from Last 1 Encounters:  07/28/17 5\' 2"  (1.575 m)    Weight:   Wt Readings from Last 1 Encounters:  04/16/19 43 kg    Ideal Body Weight:  50 kg  BMI:  Body mass index is 17.34 kg/m.  Estimated Nutritional Needs:   Kcal:  1300-1500  Protein:  50-65 grams  Fluid:  > 1.3 L    Safiya Girdler A. Jimmye Norman, RD, LDN, [place] Registered Dietitian II Certified  Diabetes Care and Education Specialist Pager: [phone] After hours Pager: [phone]

## 2019-04-16 NOTE — Evaluation (Signed)
Physical Therapy Evaluation Patient Details Name: Mary Villa MRN: [id number] DOB: [birth year] Today's Date: 04/16/2019   History of Present Illness  78 yo female admitted from [facility] with AMS and symptomatic anemia. PMhx: arthritis, asthma  Clinical Impression  Pt supine on arrival, soft spoken and able to perform transfers and hall ambulation. Pt confused regarding orientation, safety and situation but able to follow single step commands, state need to void and perform pericare without assist. Pt did require cues for how to turn on water and adjust during washing her hands. Pt demonstrates decreased strength, transfers, gait, mobility and cognition who will benefit from acute therapy to maximize mobility and independence. Pt lives at [facility] and if facility able to provide higher level of care then [facility] may be appropriate but anticipate pt will need SNF for assist with all aspects of care at D/C.     Follow Up Recommendations Home health PT;Supervision for mobility/OOB;SNF(SNF unless ALF able to provide assist for all mobility)    Equipment Recommendations  None recommended by PT    Recommendations for Other Services OT consult     Precautions / Restrictions Precautions Precautions: Fall Restrictions Weight Bearing Restrictions: No      Mobility  Bed Mobility Overal bed mobility: Needs Assistance Bed Mobility: Supine to Sit     Supine to sit: Supervision Sit to supine: Mod assist   General bed mobility comments: pt able to transition from supine to sit with bed flat and use of rail with increased time, cues to scoot fully to EOB  Transfers Overall transfer level: Needs assistance Equipment used: Rolling walker (2 wheeled) Transfers: Sit to/from Stand Sit to Stand: Min guard         General transfer comment: pt able to stand from bed without assist. Cues for hand placement for rise from toilet and descent to chair  Ambulation/Gait Ambulation/Gait assistance:  Min guard Gait Distance (Feet): 240 Feet Assistive device: Rolling walker (2 wheeled) Gait Pattern/deviations: Step-through pattern;Decreased stride length;Trunk flexed   Gait velocity interpretation: 1.31 - 2.62 ft/sec, indicative of limited community ambulator General Gait Details: cues for posture, proximity to RW and directional cues.  Stairs            Wheelchair Mobility    Modified Rankin (Stroke Patients Only)       Balance Overall balance assessment: Needs assistance Sitting-balance support: No upper extremity supported;Feet supported Sitting balance-Leahy Scale: Fair     Standing balance support: No upper extremity supported;Bilateral upper extremity supported;During functional activity Standing balance-Leahy Scale: Poor Standing balance comment: pt able to stand at sink without UE support, RW for gait                             Pertinent Vitals/Pain Pain Assessment: No/denies pain Faces Pain Scale: Hurts a little bit Pain Location: generalized Pain Descriptors / Indicators: Discomfort Pain Intervention(s): Monitored during session;Repositioned;Limited activity within patient's tolerance    Home Living Family/patient expects to be discharged to:: Private residence Living Arrangements: Alone Available Help at Discharge: Family;Available 24 hours/day Type of Home: Assisted living Home Access: Level entry     Home Layout: One level Home Equipment: Walker - 2 wheels;Cane - single point      Prior Function Level of Independence: Needs assistance   Gait / Transfers Assistance Needed: pt states she walks with walker  ADL's / Homemaking Assistance Needed: assist for cooking and cleaning, states she stands to shower and  dresses herself  Comments: impaired vision     Hand Dominance   Dominant Hand: Right    Extremity/Trunk Assessment   Upper Extremity Assessment Upper Extremity Assessment: Generalized weakness    Lower Extremity  Assessment Lower Extremity Assessment: Generalized weakness    Cervical / Trunk Assessment Cervical / Trunk Assessment: Kyphotic  Communication   Communication: No difficulties  Cognition Arousal/Alertness: Awake/alert Behavior During Therapy: WFL for tasks assessed/performed Overall Cognitive Status: Impaired/Different from baseline Area of Impairment: Memory;Following commands;Safety/judgement;Problem solving;Awareness;Orientation                 Orientation Level: Place;Time;Situation   Memory: Decreased recall of precautions;Decreased short-term memory Following Commands: Follows one step commands consistently Safety/Judgement: Decreased awareness of safety Awareness: Emergent Problem Solving: Difficulty sequencing;Requires verbal cues;Requires tactile cues General Comments: pt with decreased vision and difficulty following gestural commands, pt confused regarding situation      General Comments    Exercises     Assessment/Plan    PT Assessment Patient needs continued PT services  PT Problem List Decreased strength;Decreased mobility;Decreased safety awareness;Decreased activity tolerance;Decreased balance;Decreased knowledge of use of DME;Decreased cognition       PT Treatment Interventions DME instruction;Therapeutic activities;Gait training;Therapeutic exercise;Patient/family education;Functional mobility training;Balance training;Cognitive remediation    PT Goals (Current goals can be found in the Care Plan section)  Acute Rehab PT Goals Patient Stated Goal: return home PT Goal Formulation: With patient Time For Goal Achievement: 04/30/19 Potential to Achieve Goals: Fair    Frequency Min 3X/week   Barriers to discharge Decreased caregiver support      Co-evaluation               AM-PAC PT "6 Clicks" Mobility  Outcome Measure Help needed turning from your back to your side while in a flat bed without using bedrails?: None Help needed moving  from lying on your back to sitting on the side of a flat bed without using bedrails?: None Help needed moving to and from a bed to a chair (including a wheelchair)?: A Little Help needed standing up from a chair using your arms (e.g., wheelchair or bedside chair)?: A Little Help needed to walk in hospital room?: A Little Help needed climbing 3-5 steps with a railing? : A Little 6 Click Score: 20    End of Session Equipment Utilized During Treatment: Gait belt Activity Tolerance: Patient tolerated treatment well Patient left: in chair;with call bell/phone within reach;with chair alarm set Nurse Communication: Mobility status PT Visit Diagnosis: Other abnormalities of gait and mobility (R26.89);Muscle weakness (generalized) (M62.81)    Time: 0732-0800 PT Time Calculation (min) (ACUTE ONLY): 28 min   Charges:   PT Evaluation $PT Eval Moderate Complexity: 1 Mod PT Treatments $Therapeutic Activity: 8-22 mins        Mary Villa, PT Acute Rehabilitation Services Pager: [phone] Office: [phone]   Mary Villa 04/16/2019, 1:15 PM

## 2019-04-16 NOTE — Progress Notes (Signed)
NCM faxed pt/ot evals to [facility] to make sure they can be able to take her back at dc.  Awaiting to her back.

## 2019-04-16 NOTE — ED Notes (Signed)
Report called to 3E

## 2019-04-16 NOTE — H&P (Signed)
Date: 04/16/2019               Patient Name:  Mary Villa MRN: [id number]  DOB: 1941/03/24 Age / Sex: 78 y.o., female   PCP: Patient, No Pcp Per         Medical Service: Internal Medicine Teaching Service         Attending Physician: Dr. Lucious Groves, DO    First Contact: Dr. Gilford Rile Pager: [phone]  Second Contact: Dr. Tarri Abernethy Pager: [phone]       After Hours (After 5p/  First Contact Pager: [phone]  weekends / holidays): Second Contact Pager: [phone]   Chief Complaint: altered mental status  History of Present Illness: This is an 78 year old female with a past medical history significant for asthma and arthritis who presented to the ED via EMS from [facility] for 1 day history of altered mental status, fever, cough.  Patient was alert and oriented to person and time and did contribute to the history in addition to her son Mary Villa and staff from [facility].  Patient denied any acute pain, dysuria, diarrhea, or recent falls.  Upon speaking with the staff that the assisted living center, they noted that she had seemed more confused today including forgetting to use her walker.  They also noted that when they checked her temperature tonight, was found to be 104 F.  She did not receive Tylenol at that time.  They also mention that the patient's roommate had commented on the patient coughing a lot last night and having chills. Patient's son, Mary Villa, was also spoken to by myself and Dr. Maricela Bo.  He noted that he has not been able to see his mom over the past 4 months due to [place] however does speak to her on the phone fairly often.  He has not noted any recent changes.  He is unaware of any other medical issues that she has had however he does imply that she had some anemia a couple of years ago however he is uncertain of the etiology of this.  He notes that she has not been complaining recently of any abnormal pain or other symptoms.   Meds:  Current Meds  Medication Sig  .  acetaminophen (TYLENOL) 325 MG tablet Take 650 mg by mouth every 6 (six) hours as needed for headache (minor discomfort/ fever up to 100 degrees orally).  Marland Kitchen ammonium lactate (LAC-HYDRIN) 12 % lotion Apply 1 application topically See admin instructions. Spread topically to feet once daily - "not between toes"  . Cholecalciferol (VITAMIN D3) 50 MCG (2000 UT) TABS Take 2,000 Units by mouth every Thursday.   . Fluticasone-Salmeterol (WIXELA INHUB) 250-50 MCG/DOSE AEPB Inhale 1 puff into the lungs 2 (two) times daily.  . mirtazapine (REMERON) 15 MG tablet Take 15 mg by mouth at bedtime. For sleep and appetite  . omeprazole (PRILOSEC) 20 MG capsule Take 20 mg by mouth daily.  Marland Kitchen Propylene Glycol (SYSTANE COMPLETE) 0.6 % SOLN Place 1 drop into both eyes 4 (four) times daily.  Marland Kitchen triamterene-hydrochlorothiazide (MAXZIDE-25) 37.5-25 MG tablet Take 1 tablet by mouth daily.     Allergies: Allergies as of 04/15/2019 - Review Complete 04/15/2019  Allergen Reaction Noted  . Penicillins Hives 07/27/2017   Past Medical History:  Diagnosis Date  . Arthritis   . Asthma     Family History: No known family history of hypertension or diabetes.  Social History: Resides at [facility].  Patient denies smoking history.  Review of Systems: Review of Systems  Constitutional: Positive for chills, fever and malaise/fatigue.  HENT: Negative for sinus pain and sore throat.   Eyes: Negative for blurred vision and double vision.  Respiratory: Positive for cough.   Cardiovascular: Negative for chest pain.  Gastrointestinal: Negative for abdominal pain, diarrhea, nausea and vomiting.  Genitourinary: Negative for dysuria.  Musculoskeletal: Positive for joint pain. Negative for falls.  Neurological: Positive for weakness. Negative for dizziness.  Psychiatric/Behavioral: Negative for depression.    Physical Exam: Blood pressure 125/78, pulse 70, temperature 99.3 F (37.4 C), temperature source Oral, resp. rate  15, SpO2 99 %.  GENERAL: in no acute distress. Chronically ill appearing. Cachetic. HEENT: head atraumatic. Endothalmia. No conjunctival injection. Nares patent. Tongue moist. No oral lesions visualized. CARDIAC: heart RRR. No peripheral edema.  PULMONARY: acyanotic. Lung sounds clear to auscultation.  Patient did exhibit what seemed to be a nonproductive cough while in the room. ABDOMEN: soft. Tender in suprapubic and lower quadrants. Nondistended. No organomegaly appreciated. NEURO: Lethargic but awakens to voice. Oriented to person and time but not to place or situation. PERRL. EOMs intact. Sensation equal and intact. No facial asymmetry. Tongue midline. Shrug strength equal bilaterally. Strength intact and equal of bilateral upper and lower extremities. Sensation intact bilaterally. SKIN: no rash or lesions on limited exam  PSYCH: normal affect.  CXR: personally reviewed. My interpretation is no apparent focal infiltrates. LABS: HGB 6.6. WBC 7.9.  Lactic acid 1.3.  Creatinine 1.67. GFR 29.  UA negative for bacteria, leukocytes, nitrites, hematuria.   Assessment & Plan by Problem: Active Problems:   Symptomatic anemia  Symptomatic Anemia. Hgb 6.6 on admission. MCV 92.  Reticulocyte percent normal at 0.9.  Etiology still unclear. Historians deny any knowledge of recent sources of blood loss. B12 deficiency.  B12 level less than 50 on admission.  Folate normal. Anemia chronic disease.  Iron 19, TIBC 174.  Ferritin elevated at 507.  Etiology likely multifactorial.  Given her cachectic appearance, malignancy certainly cannot be ruled out. Son is uncertain of when she had her last colonoscopy. Stool occult negative.  He does imply that she was anemic to the extent of requiring blood transfusion 2 years ago however he is uncertain of etiology of that anemia.  Patient independent with ADLs so staff are unaware of any hematuria or bloody stools the patient could be having. There were no obvious  lung lesions on chest x-ray however they did comment on extensive pleural parenchymal scarring at the lung apices with scattered areas of scarring and likely calcified granulomas bilaterally.  Further imaging with a chest CT may be able to confirm the pleural-parenchymal scarring.   In the setting of slightly increased calcium for 2 years ago, declining renal function, and anemia, multiple myeloma workup starting with peripheral smear may be appropriate. Total serum protein has increased from 6.4-->8.2 during those 2 years. No protein noted in urine analysis however this would not be unusual given the elevation in M protein seen in MM is not albumin.  If patient has chronic renal failure, then this is likely to be playing a role in the anemia.  Bili/LFTs not elevated so unlikely to be related to hemolysis.   Patient was reported to have a fever of 104 F at the assisted care center.  She did not receive Tylenol at that time and temperature was 99.8 in the ED.  No leukocytosis on admission.  Chest x-ray was unremarkable however I would have a low threshold for repeating  a chest x-ray tomorrow given the patient's cough and possible fever at the assist care. Continue to trend temperatures Plan: Obtain peripheral smear to evaluate hematologic disorder or malignancy Transfuse 2 units PRBCs with follow-up hemoglobin 2 hours posttransfusion   COVID pending Repeat CBC, BMP and lactic acid in a.m. Consider nutrition consult for evaluation of malnutrition Obtain weight to evaluate for weight loss  Acute on chronic renal injury. Creatinine 1.67. GFR 29.  2018 values: Cr 1.14, GFR 45.  Unclear of what patient's baseline.  Assisted care center and patient's son were unaware of any renal issues that the patient has.  Awaiting records from assisted care including progress notes which hopefully give [place] more insight into this.  Son and staff deny any ibuprofen or NSAID use.  Does not appear like she is on any  nephrotoxic medications.  Asthma.  Continue Dulera 2 puffs 2 times daily  It appears that the patient is on mirtazapine however I am unclear as to the reason for this.  Perhaps for her appetite.  We will continue this at 15 mg at bedtime.  CODE STATUS: Full Diet: Regular diet Glucose control: IV fluids: Received 500 mL bolus in ED. GI prophylaxis: PRN Senokot and promethazine DVT for prophylaxis: Heparin 5000 units every 8 hours Micro: Blood cultures, urine cultures pending Pain management: Tylenol Social considerations:  Awaiting MAR and progress note from [facility] assisted living. Son, Mary Villa, can also be reached at [phone] with questions.    Dispo: Admit patient to Inpatient with expected length of stay greater than 2 midnights.  Signed: Mitzi Hansen, MD 04/16/2019, 12:43 AM  Pager: '@[phone] @

## 2019-04-16 NOTE — Progress Notes (Addendum)
   Subjective:  Mary Villa was seen at bedside this morning. She was eating breakfast and enjoying it. She states that she slept well last night. She feels that she is a little altered and unable to recall recent events as well as she did in the past. She is aware of mild confusion which she finds frustrating.   Objective:  Vital signs in last 24 hours: Vitals:   04/16/19 0451 04/16/19 0500 04/16/19 0730 04/16/19 0810  BP: 110/66   112/62  Pulse: 81   72  Resp: 20   15  Temp: 98.3 F (36.8 C)   99 F (37.2 C)  TempSrc: Axillary  Axillary Oral  SpO2: 100%   100%  Weight:  43 kg     General: Alert but not oriented to location, recent events or date. In no acute distress, afebrile, nondiaphoretic HEENT: PEERL, EMO intact Cardio: RRR, no mrg's  Pulmonary: CTA bilaterally, no wheezing, or crackles  Abdomen: Bowel sounds normal, soft, nontender  MSK: BLE nontender, nonedematous Neuro: Alert, conversational Psych: Appropriate affect, not depressed in appearance, engages well  Assessment/Plan:  Active Problems:   Symptomatic anemia  Symptomatic Anemia:  - Likely 2/2 to chronic anemia - Fe: 19 - TIBC 174 - 507 - Retic Ct: 20.9 - Hgb post transfusion: 10.3 - Smear: Pending  Vitamin B12 Deficiency:  - B12: <50 - B12 injection today 1000 mg  - Weekly outpatient B12 administration until levels normalize.    Acute on Chronic Renal Injury:  - Likely 2/2 to dehydration - Baseline Cr: 1.14 - Cr: 1.67->1.64 - GFR: 35 - Continue to monitor creatinine.   Asthma:  - Continue Dulera treatment PRN.   Dispo: Anticipated discharge pending medical course.   Kathi Ludwig, MD 04/16/2019, 8:44 AM Pager: # [phone]

## 2019-04-16 NOTE — Evaluation (Signed)
Occupational Therapy Evaluation Patient Details Name: Mary Villa MRN: [id number] DOB: 1941-04-19 Today's Date: 04/16/2019    History of Present Illness 78 yo female admitted from [facility] with AMS and symptomatic anemia. PMhx: arthritis, asthma   Clinical Impression   PTA patient reports completing ADLs without assist, using walker for mobility; living at [facility], unclear on level of assist available (as she reports her family is available to assist as well).  Admitted for above and limited by problem list below, including generalized weakness, impaired balance, decreased activity tolerance, decreased safety awareness, impaired vision, and impaired cognition.  Cognitively, patient able to follow simple 1 step commands with increased time, is oriented to self, but unaware of place, and requires multimodal cueing for safety/walker mgmt--recommend further cognitive assessment.  She is able to complete transfers with min assist, in room mobility using RW with min assist, toileting with min assist and UB/LB ADls with min assist.  She will benefit from continued OT services while admitted and after dc at SNF level in order to maximize independence and safety with ADLs/mobility. At this time she will require 24/7 assistance.     Follow Up Recommendations  SNF;Supervision/Assistance - 24 hour    Equipment Recommendations  3 in 1 bedside commode    Recommendations for Other Services PT consult     Precautions / Restrictions Precautions Precautions: Fall Restrictions Weight Bearing Restrictions: No      Mobility Bed Mobility Overal bed mobility: Needs Assistance Bed Mobility: Supine to Sit;Sit to Supine     Supine to sit: Min assist Sit to supine: Mod assist   General bed mobility comments: min assist for trunk support to EOB, B LEs support back to supine; increased time and effort  Transfers Overall transfer level: Needs assistance Equipment used: Rolling walker (2  wheeled) Transfers: Sit to/from Stand Sit to Stand: Min assist         General transfer comment: min assist to power up, safety and balance; cueing for hand placement     Balance Overall balance assessment: Needs assistance Sitting-balance support: No upper extremity supported;Feet supported Sitting balance-Leahy Scale: Fair     Standing balance support: No upper extremity supported;Bilateral upper extremity supported;During functional activity Standing balance-Leahy Scale: Poor Standing balance comment: able to stand and grooming without UE support statically, but relaint on UE support dynamically                           ADL either performed or assessed with clinical judgement   ADL Overall ADL's : Needs assistance/impaired     Grooming: Minimal assistance;Standing;Wash/dry hands;Oral care   Upper Body Bathing: Minimal assistance;Sitting   Lower Body Bathing: Minimal assistance;Sit to/from stand   Upper Body Dressing : Minimal assistance;Sitting   Lower Body Dressing: Minimal assistance;Sit to/from stand Lower Body Dressing Details (indicate cue type and reason):   Toilet Transfer: RW;Minimal Holiday representative;Ambulation   Toileting- Clothing Manipulation and Hygiene: Minimal assistance;Sit to/from stand       Functional mobility during ADLs: Minimal assistance;Rolling walker;Cueing for safety;Cueing for sequencing General ADL Comments: pt limited by impaired balance, generalized weakness, decreased safety, and imapired vision      Vision Patient Visual Report: No change from baseline Vision Assessment?: Vision impaired- to be further tested in functional context Additional Comments: multimodal cueing to locate items at sink, cueing for visual scanning to safely reach bathroom      Perception     Praxis  Pertinent Vitals/Pain Pain Assessment: Faces Faces Pain Scale: Hurts a little bit Pain Location: generalized Pain Descriptors /  Indicators: Discomfort Pain Intervention(s): Monitored during session;Repositioned;Limited activity within patient's tolerance     Hand Dominance Right   Extremity/Trunk Assessment Upper Extremity Assessment Upper Extremity Assessment: Generalized weakness(R FF limited to 60 degrees )   Lower Extremity Assessment Lower Extremity Assessment: Defer to PT evaluation       Communication Communication Communication: No difficulties   Cognition Arousal/Alertness: Awake/alert Behavior During Therapy: Flat affect Overall Cognitive Status: Impaired/Different from baseline Area of Impairment: Memory;Following commands;Safety/judgement;Problem solving;Awareness                     Memory: Decreased recall of precautions;Decreased short-term memory Following Commands: Follows one step commands inconsistently;Follows one step commands with increased time Safety/Judgement: Decreased awareness of safety Awareness: Emergent Problem Solving: Difficulty sequencing;Requires verbal cues;Requires tactile cues General Comments: pt requires multimodal cueing, cueing for safety/use of RW; oriented to self (reports "I dont know where I am")   General Comments  poor safety awareness with using RW to avoid obstacles in room, cueing to use both hands on walker     Exercises     Shoulder Instructions      Home Living Family/patient expects to be discharged to:: Private residence Living Arrangements: Alone Available Help at Discharge: Family;Available 24 hours/day Type of Home: Assisted living Home Access: Level entry     Home Layout: One level     Bathroom Shower/Tub: Occupational psychologist: Handicapped height     Home Equipment: Environmental consultant - 2 wheels;Cane - single point          Prior Functioning/Environment Level of Independence: Needs assistance  Gait / Transfers Assistance Needed: pt states she walks with walker ADL's / Homemaking Assistance Needed: assist for  cooking and cleaning, states she stands to shower and dresses herself   Comments: impaired vision        OT Problem List: Decreased strength;Decreased activity tolerance;Impaired balance (sitting and/or standing);Decreased knowledge of precautions;Decreased knowledge of use of DME or AE;Decreased safety awareness;Decreased cognition      OT Treatment/Interventions: Self-care/ADL training;DME and/or AE instruction;Therapeutic activities;Patient/family education;Balance training;Cognitive remediation/compensation;Visual/perceptual remediation/compensation    OT Goals(Current goals can be found in the care plan section) Acute Rehab OT Goals Patient Stated Goal: to lay back down  OT Goal Formulation: With patient Time For Goal Achievement: 04/30/19 Potential to Achieve Goals: Fair  OT Frequency: Min 2X/week   Barriers to D/C:            Co-evaluation              AM-PAC OT "6 Clicks" Daily Activity     Outcome Measure Help from another person eating meals?: A Little Help from another person taking care of personal grooming?: A Little Help from another person toileting, which includes using toliet, bedpan, or urinal?: A Little Help from another person bathing (including washing, rinsing, drying)?: A Little Help from another person to put on and taking off regular upper body clothing?: A Little Help from another person to put on and taking off regular lower body clothing?: A Little 6 Click Score: 18   End of Session Equipment Utilized During Treatment: Rolling walker Nurse Communication: Mobility status  Activity Tolerance: Patient limited by fatigue Patient left: in bed;with call bell/phone within reach;with bed alarm set;with nursing/sitter in room  OT Visit Diagnosis: Other abnormalities of gait and mobility (R26.89);Muscle weakness (generalized) (M62.81);Other symptoms and signs  involving cognitive function;Low vision, both eyes (H54.2)                Time:  1047-1105 OT Time Calculation (min): 18 min Charges:  OT General Charges $OT Visit: 1 Visit OT Evaluation $OT Eval Moderate Complexity: 1 Mod  Chancy Milroyhristie S Capitola Ladson, OT Acute Rehabilitation Services Pager [phone] Office [phone]   Chancy MilroyChristie S Reginaldo Hazard 04/16/2019, 12:40 PM

## 2019-04-17 DIAGNOSIS — E46 Unspecified protein-calorie malnutrition: Secondary | ICD-10-CM

## 2019-04-17 DIAGNOSIS — Z9889 Other specified postprocedural states: Secondary | ICD-10-CM

## 2019-04-17 DIAGNOSIS — E43 Unspecified severe protein-calorie malnutrition: Secondary | ICD-10-CM | POA: Diagnosis present

## 2019-04-17 DIAGNOSIS — F0390 Unspecified dementia without behavioral disturbance: Secondary | ICD-10-CM | POA: Diagnosis present

## 2019-04-17 DIAGNOSIS — G9340 Encephalopathy, unspecified: Secondary | ICD-10-CM

## 2019-04-17 DIAGNOSIS — Z681 Body mass index (BMI) 19 or less, adult: Secondary | ICD-10-CM

## 2019-04-17 LAB — BASIC METABOLIC PANEL
Anion gap: 6 (ref 5–15)
BUN: 24 mg/dL — ABNORMAL HIGH (ref 8–23)
CO2: 21 mmol/L — ABNORMAL LOW (ref 22–32)
Calcium: 8.7 mg/dL — ABNORMAL LOW (ref 8.9–10.3)
Chloride: 114 mmol/L — ABNORMAL HIGH (ref 98–111)
Creatinine, Ser: 1.53 mg/dL — ABNORMAL HIGH (ref 0.44–1.00)
GFR calc Af Amer: 37 mL/min — ABNORMAL LOW (ref >60)
GFR calc non Af Amer: 32 mL/min — ABNORMAL LOW (ref >60)
Glucose, Bld: 79 mg/dL (ref 70–99)
Potassium: 3.7 mmol/L (ref 3.5–5.1)
Sodium: 141 mmol/L (ref 135–145)

## 2019-04-17 LAB — BPAM RBC
Blood Product Expiration Date: 202009222359
Blood Product Expiration Date: 202009222359
ISSUE DATE / TIME: 202008310113
ISSUE DATE / TIME: 202008310425
Unit Type and Rh: 6200
Unit Type and Rh: 6200

## 2019-04-17 LAB — TYPE AND SCREEN
ABO/RH(D): A POS
Antibody Screen: NEGATIVE
Unit division: 0
Unit division: 0

## 2019-04-17 LAB — CBC
HCT: 29.5 % — ABNORMAL LOW (ref 36.0–46.0)
Hemoglobin: 9.8 g/dL — ABNORMAL LOW (ref 12.0–15.0)
MCH: 29.1 pg (ref 26.0–34.0)
MCHC: 33.2 g/dL (ref 30.0–36.0)
MCV: 87.5 fL (ref 80.0–100.0)
Platelets: 203 10*3/uL (ref 150–400)
RBC: 3.37 MIL/uL — ABNORMAL LOW (ref 3.87–5.11)
RDW: 15.5 % (ref 11.5–15.5)
WBC: 5.6 10*3/uL (ref 4.0–10.5)
nRBC: 0 % (ref 0.0–0.2)

## 2019-04-17 LAB — PATHOLOGIST SMEAR REVIEW

## 2019-04-17 MED ORDER — VITAMIN B-12 1000 MCG PO TABS: 2000.0000 ug | ORAL_TABLET | Freq: Every day | ORAL | 0 refills | Status: AC

## 2019-04-17 NOTE — TOC Progression Note (Signed)
Transition of Care [facility]) - Progression Note    Patient Details  Name: Mary Villa MRN: [id number] Date of Birth: [birth year]  Transition of Care [facility]) CM/SW Contact  Zenon Mayo, RN Phone Number: 04/17/2019, 10:25 AM  Clinical Narrative:    NCM received call from [facility], stating they will be able to provide the support patient needs and will be able to take her back at discharge. She will need to be transported by ptar.        Expected Discharge Plan and Services                                                 Social Determinants of Health (SDOH) Interventions    Readmission Risk Interventions No flowsheet data found.

## 2019-04-17 NOTE — Progress Notes (Signed)
Pt. Discharge to [facility], report give to Ms. Shanda Bumps. Awaiting for PTAR to transport.

## 2019-04-17 NOTE — Plan of Care (Signed)
  Problem: Education: Goal: Knowledge of General Education information will improve Description: Including pain rating scale, medication(s)/side effects and non-pharmacologic comfort measures Outcome: Progressing   Problem: Activity: Goal: Risk for activity intolerance will decrease Outcome: Progressing   Problem: Nutrition: Goal: Adequate nutrition will be maintained Outcome: Progressing   

## 2019-04-17 NOTE — Progress Notes (Signed)
Physical Therapy Treatment Patient Details Name: Mary Villa MRN: [id number] DOB: 1940/10/27 Today's Date: 04/17/2019    History of Present Illness [age] yo female admitted from [facility] with AMS and symptomatic anemia. PMhx: arthritis, asthma    PT Comments    Pt up with nurse tech on arrival. Pt with improved orientation aware she is in the hospital but not oriented to time or situation. Pt assisted to don shoes and required assist for pericare for completeness but able to state need to void x 2 during session. Gait limited by left knee pain with pt able to perform HEP with cues. Will continue to follow.    Follow Up Recommendations  Home health PT;Supervision for mobility/OOB;SNF     Equipment Recommendations  None recommended by PT    Recommendations for Other Services       Precautions / Restrictions Precautions Precautions: Fall    Mobility  Bed Mobility               General bed mobility comments: BSC on arrival  Transfers Overall transfer level: Needs assistance   Transfers: Sit to/from Stand;Stand Pivot Transfers Sit to Stand: Min guard Stand pivot transfers: Min guard       General transfer comment: pt [facility], chair x 1, cues for hand placement and safety. pivot BSC <>chair with minguard and cues for direction  Ambulation/Gait Ambulation/Gait assistance: Min guard Gait Distance (Feet): 150 Feet Assistive device: Rolling walker (2 wheeled) Gait Pattern/deviations: Step-through pattern;Decreased stride length;Trunk flexed   Gait velocity interpretation: 1.31 - 2.62 ft/sec, indicative of limited community ambulator General Gait Details: cues for posture, proximity to RW and directional cues. Distance limited by left knee pain   Stairs             Wheelchair Mobility    Modified Rankin (Stroke Patients Only)       Balance Overall balance assessment: Needs assistance Sitting-balance support: No upper extremity  supported;Feet supported Sitting balance-Leahy Scale: Fair     Standing balance support: No upper extremity supported;Bilateral upper extremity supported;During functional activity Standing balance-Leahy Scale: Poor Standing balance comment: pt able to stand without UE support, RW for gait                            Cognition Arousal/Alertness: Awake/alert Behavior During Therapy: WFL for tasks assessed/performed Overall Cognitive Status: Impaired/Different from baseline Area of Impairment: Orientation;Memory                 Orientation Level: Time;Situation       Safety/Judgement: Decreased awareness of safety   Problem Solving: Slow processing;Requires verbal cues        Exercises General Exercises - Lower Extremity Long Arc Quad: AROM;Both;Seated;10 reps Hip ABduction/ADduction: AROM;Right;Seated;10 reps Hip Flexion/Marching: AROM;Both;Seated;10 reps    General Comments        Pertinent Vitals/Pain Faces Pain Scale: Hurts little more Pain Location: left knee Pain Descriptors / Indicators: Aching Pain Intervention(s): Limited activity within patient's tolerance;Repositioned    Home Living                      Prior Function            PT Goals (current goals can now be found in the care plan section) Progress towards PT goals: Progressing toward goals    Frequency    Min 3X/week      PT  Plan Current plan remains appropriate    Co-evaluation              AM-PAC PT "6 Clicks" Mobility   Outcome Measure  Help needed turning from your back to your side while in a flat bed without using bedrails?: None Help needed moving from lying on your back to sitting on the side of a flat bed without using bedrails?: None Help needed moving to and from a bed to a chair (including a wheelchair)?: A Little Help needed standing up from a chair using your arms (e.g., wheelchair or bedside chair)?: A Little Help needed to walk in  hospital room?: A Little Help needed climbing 3-5 steps with a railing? : A Little 6 Click Score: 20    End of Session   Activity Tolerance: Patient tolerated treatment well Patient left: in chair;with call bell/phone within reach;with chair alarm set Nurse Communication: Mobility status PT Visit Diagnosis: Other abnormalities of gait and mobility (R26.89);Muscle weakness (generalized) (M62.81)     Time: 9798-9211 PT Time Calculation (min) (ACUTE ONLY): 25 min  Charges:  $Gait Training: 8-22 mins $Therapeutic Activity: 8-22 mins                     [place], PT Acute Rehabilitation Services Pager: [phone] Office: [place] 04/17/2019, 9:15 AM

## 2019-04-17 NOTE — Discharge Summary (Signed)
Name: Mary Villa MRN: [id number] DOB: January 20, 1941 78 y.o. PCP: Patient, No Pcp Per  Date of Admission: 04/15/2019  8:59 PM Date of Discharge: 04/17/2019 Attending Physician: Tyson AliasVincent, Duncan Thomas, *  Discharge Diagnosis: 1. B-12 deficiency anemia   2. Polypharmacy   Discharge Medications: Allergies as of 04/17/2019      Reactions   Penicillins Hives   Has patient had a PCN reaction causing immediate rash, facial/tongue/throat swelling, SOB or lightheadedness with hypotension: Yes Has patient had a PCN reaction causing severe rash involving mucus membranes or skin necrosis: No Has patient had a PCN reaction that required hospitalization: Unknown Has patient had a PCN reaction occurring within the last 10 years: Unknown If all of the above answers are "NO", then may proceed with Cephalosporin use.      Medication List    STOP taking these medications   traMADol 50 MG tablet Commonly known as: ULTRAM     TAKE these medications   acetaminophen 325 MG tablet Commonly known as: TYLENOL Take 650 mg by mouth every 6 (six) hours as needed for headache (minor discomfort/ fever up to 100 degrees orally).   acetaminophen 500 MG tablet Commonly known as: TYLENOL Take 2 tablets (1,000 mg total) by mouth every 6 (six) hours as needed.   ammonium lactate 12 % lotion Commonly known as: LAC-HYDRIN Apply 1 application topically See admin instructions. Spread topically to feet once daily - "not between toes"   mirtazapine 15 MG tablet Commonly known as: REMERON Take 15 mg by mouth at bedtime. For sleep and appetite   omeprazole 20 MG capsule Commonly known as: PRILOSEC Take 20 mg by mouth daily.   Systane Complete 0.6 % Soln Generic drug: Propylene Glycol Place 1 drop into both eyes 4 (four) times daily.   triamterene-hydrochlorothiazide 37.5-25 MG tablet Commonly known as: MAXZIDE-25 Take 1 tablet by mouth daily.   vitamin B-12 1000 MCG tablet Commonly known as:  CYANOCOBALAMIN Take 2 tablets (2,000 mcg total) by mouth daily.   Vitamin D3 50 MCG (2000 UT) Tabs Take 2,000 Units by mouth every Thursday.   Wixela Inhub 250-50 MCG/DOSE Aepb Generic drug: Fluticasone-Salmeterol Inhale 1 puff into the lungs 2 (two) times daily.       Disposition and follow-up:   [facility]  At the hospital follow up visit please address:  1.  B-12 deficiency anemia: combined with anemia of chronic disease. Tx with IM injection of B-12. Recommend high dose B-12 2000mcg daily and B-12/CBC in 3-4 weeks. Acute encephalopathy: Tramadol is a common cause of altered mental status in the elderly and should be avoided in this population.  Acute renal injury: Please follow-up BMP in 1-2 weeks and encourage PO intake of fluids and solids.   2.  Labs / imaging needed at time of follow-up: BMP in 1-2 weeks, CBC and B12 in 3-4 weeks  3.  Pending labs/ test needing follow-up: n/a  Follow-up Appointments:   Hospital Course by problem list: 1. Mary Villa is an 78yr old female with a PMHx notable for asthma, arthritis, and dementia who presented with acute encephalopathy and fever of 100.4 for an assisted living facility. She was noted to have severe B12 deficiency with associated anemia of 6.8 and a reticulocyte count consistent with decreased bone marrow production of RBC's. This was combined with iron deficiency of chronic disease indicating the likehood of a combined etiology of her anemia. Her B12 repletion  was initiated with an IM injection and she was discharged on 2056mcg's B12 daily.   Acute renal injury: Baseline serum creatinine uncertain of recent but was 1.14 a year prior. Given the >0.3 increase this admission with her elevated BUN and subsequent improvement with hydration and increased PO intake this was felt to be due to dehydration from poor oral intake. Encourage feeding assistance and  supplementation with products such as Boost or Ensure.   Discharge Vitals:   BP 100/71 (BP Location: Right Arm)   Pulse (!) 58   Temp 98.5 F (36.9 C) (Oral)   Resp 15   Wt 42.8 kg   SpO2 100%   BMI 17.26 kg/m   Pertinent Labs, Studies, and Procedures:  CMP Latest Ref Rng & Units 04/17/2019 04/16/2019 04/15/2019  Glucose 70 - 99 mg/dL 79 80 119(H)  BUN 8 - 23 mg/dL 24(H) 29(H) 32(H)  Creatinine 0.44 - 1.00 mg/dL 1.53(H) 1.61(H) 1.67(H)  Sodium 135 - 145 mmol/L 141 139 136  Potassium 3.5 - 5.1 mmol/L 3.7 4.0 4.4  Chloride 98 - 111 mmol/L 114(H) 111 106  CO2 22 - 32 mmol/L 21(L) 20(L) 20(L)  Calcium 8.9 - 10.3 mg/dL 8.7(L) 8.7(L) 9.2  Total Protein 6.5 - 8.1 g/dL - - 8.2(H)  Total Bilirubin 0.3 - 1.2 mg/dL - - 0.6  Alkaline Phos 38 - 126 U/L - - 56  AST 15 - 41 U/L - - 19  ALT 0 - 44 U/L - - 8    Ref Range & Units 2d ago  Vitamin B-12 180 - 914 pg/mL <50Low      Ref Range & Units 2d ago  Folate >5.9 ng/mL 22.0     Ref Range & Units 2d ago  Ferritin 11 - 307 ng/mL 507High      Ref Range & Units 2d ago  Iron 28 - 170 ug/dL 19Low    TIBC 250 - 450 ug/dL 174Low    Saturation Ratios 10.4 - 31.8 % 11   UIBC ug/dL 155     Ref Range & Units 2d ago  Retic Ct Pct 0.4 - 3.1 % 0.9   RBC. 3.87 - 5.11 MIL/uL 2.30Low    Retic Count, Absolute 19.0 - 186.0 K/uL 20.9   Immature Retic Fract 2.3 - 15.9 % 11.6    Discharge Instructions: Discharge Instructions    Call MD for:  difficulty breathing, headache or visual disturbances   Complete by: As directed    Call MD for:  persistant dizziness or light-headedness   Complete by: As directed    Call MD for:  redness, tenderness, or signs of infection (pain, swelling, redness, odor or green/yellow discharge around incision site)   Complete by: As directed    Call MD for:  severe uncontrolled pain   Complete by: As directed    Call MD for:  temperature >100.4   Complete by: As directed    Diet - low sodium heart healthy   Complete by:  As directed    Discharge instructions   Complete by: As directed    You will need to take high doses of oral vitamin B-12 (2024mcg) daily for now on. Please take this in one dose to maximize absorption. You facility will need to obtain a B12 level in 3-4 weeks to make certain you levels are improving. There were undetectable when you were admitted.  Please take a daily vitamin.  Please make certain to eat more daily. If you have difficulty eating, please ask  for staff assistance.   Increase activity slowly   Complete by: As directed       Signed: Lanelle Bal, MD 04/17/2019, 1:28 PM   Pager: # [phone]

## 2019-04-17 NOTE — Progress Notes (Signed)
Subjective: Patient seen at the bedside on rounds this morning.  Sitting up in chair.  Patient says she was very confused because last thing she remembered was that she was at her home facility, then woke up in the hospital.  She says she is ready to go home today.  Objective:  Vital signs in last 24 hours: Vitals:   04/17/19 0751 04/17/19 0838 04/17/19 1109 04/17/19 1129  BP: (!) 105/57  (!) 103/57 100/71  Pulse: 62  (!) 59 (!) 58  Resp: 18  18 15   Temp:   98.4 F (36.9 C) 98.5 F (36.9 C)  TempSrc:   Oral Oral  SpO2: 98% 99% 100% 100%  Weight:       Physical Exam Vitals signs reviewed.  Constitutional:      General: She is not in acute distress.    Appearance: Normal appearance. She is not toxic-appearing.  HENT:     Head: Normocephalic and atraumatic.  Eyes:     General: No scleral icterus.       Right eye: No discharge.        Left eye: No discharge.     Extraocular Movements: Extraocular movements intact.  Cardiovascular:     Rate and Rhythm: Normal rate and regular rhythm.     Pulses: Normal pulses.     Heart sounds: Normal heart sounds. No murmur. No friction rub. No gallop.   Pulmonary:     Effort: Pulmonary effort is normal. No respiratory distress.     Breath sounds: Normal breath sounds. No wheezing or rales.  Abdominal:     General: Abdomen is flat. Bowel sounds are normal. There is no distension.     Palpations: Abdomen is soft.     Tenderness: There is no abdominal tenderness. There is no guarding.  Musculoskeletal:        General: No swelling or deformity.     Right lower leg: No edema.     Left lower leg: No edema.  Skin:    General: Skin is warm.  Neurological:     General: No focal deficit present.     Mental Status: She is alert.  Psychiatric:        Mood and Affect: Mood normal.    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  Since her admission, she has not been febrile.  Work-up was notable for anemia of 6.6 and AKI.  Patient is now status post 2U of pRBC with adequate response.   #Anemia of chronic disease #Symptomatic Anemia: Hgb 6.6 on admission, now s/p 2 U pRBC. Hgb increased to 10.3, now 9.8 today. Iron studies consistent with anemia of chronic disease (iron 19, TIBC 174, ferratin 507). Reticulocyte count inappropriately normal at 20.9 suggesting production issue.  - Continue to monitor, appears hemodynamically stable at the moment.   #Vitamin B12 Deficiency: <50 on admission  - Received B12 injection 1000 mg  - Oral B12 supplementation upon d/c 2000 mg daily   #AKI #CKD stage III: Cr at 1.53 from 1.61 yesterday. Was 1.1 from labs in 2018. I suspect her Cr is at  her new baseline, and her kidney disease has worsened over the last couple years. Suspect AKI due to dehydration and decreased oral intake, as pt appears malnourished on exam. - Continue to monitor creatinine.   #Malnutrition  - Dieitian consult on 8/31, appreciate recs  Dispo: Anticipated discharge pending medical course. PT/OT recommend d/c to SNF.  Earlene Plater, MD Internal Medicine, PGY1 Pager: [phone]  04/17/2019,2:21 PM

## 2019-04-17 NOTE — [place] FL2 (Signed)
[place] MEDICAID FL2 LEVEL OF CARE SCREENING TOOL     IDENTIFICATION  Patient Name: Mary ClackDorothy G Mollenkopf Birthdate: 1940-10-07 Sex: female Admission Date (Current Location): 04/15/2019  [facility]   Facility and Address:  [facility] [place], [address], [place], [place] [address]      Provider Number: [id number]  Attending Physician Name and Address:  Tyson AliasVincent, Duncan Thomas, *  Relative Name and Phone Number:  Trish MageJessica Mccomick [phone]    Current Level of Care: Hospital Recommended Level of Care: Assisted Living Facility Prior Approval Number:    Date Approved/Denied: 11/11/10 PASRR Number:    Discharge Plan: (ALF)    Current Diagnoses: Patient Active Problem List   Diagnosis Date Noted  . Protein-calorie malnutrition, severe 04/17/2019  . Dementia (HCC) 04/17/2019  . Vitamin B12 deficiency anemia, unspecified 04/16/2019  . Acute on chronic renal failure (HCC) 04/16/2019  . AKI (acute kidney injury) (HCC) 04/16/2019  . Symptomatic anemia 04/15/2019  . Anemia 08/11/2017    Orientation RESPIRATION BLADDER Height & Weight     Self  Normal Continent Weight: 42.8 kg Height:     BEHAVIORAL SYMPTOMS/MOOD NEUROLOGICAL BOWEL NUTRITION STATUS      Continent Diet  AMBULATORY STATUS COMMUNICATION OF NEEDS Skin   Limited Assist Verbally Normal                       Personal Care Assistance Level of Assistance  Bathing, Feeding, Dressing Bathing Assistance: Limited assistance Feeding assistance: Limited assistance Dressing Assistance: Limited assistance     Functional Limitations Info  Sight, Hearing, Speech Sight Info: Adequate Hearing Info: Adequate Speech Info: Adequate    SPECIAL CARE FACTORS FREQUENCY  PT (By licensed PT), OT (By licensed OT)                    Contractures Contractures Info: Not present    Additional Factors Info  Code Status, Allergies Code Status Info: full Allergies  Info: Penicillins           TAKE these medications   acetaminophen 325 MG tablet Commonly known as: TYLENOL Take 650 mg by mouth every 6 (six) hours as needed for headache (minor discomfort/ fever up to 100 degrees orally).   acetaminophen 500 MG tablet Commonly known as: TYLENOL Take 2 tablets (1,000 mg total) by mouth every 6 (six) hours as needed.   ammonium lactate 12 % lotion Commonly known as: LAC-HYDRIN Apply 1 application topically See admin instructions. Spread topically to feet once daily - "not between toes"   mirtazapine 15 MG tablet Commonly known as: REMERON Take 15 mg by mouth at bedtime. For sleep and appetite   omeprazole 20 MG capsule Commonly known as: PRILOSEC Take 20 mg by mouth daily.   Systane Complete 0.6 % Soln Generic drug: Propylene Glycol Place 1 drop into both eyes 4 (four) times daily.   triamterene-hydrochlorothiazide 37.5-25 MG tablet Commonly known as: MAXZIDE-25 Take 1 tablet by mouth daily.   vitamin B-12 1000 MCG tablet Commonly known as: CYANOCOBALAMIN Take 2 tablets (2,000 mcg total) by mouth daily.   Vitamin D3 50 MCG (2000 UT) Tabs Take 2,000 Units by mouth every Thursday.   Wixela Inhub 250-50 MCG/DOSE Aepb Generic drug: Fluticasone-Salmeterol Inhale 1 puff into the lungs 2 (two) times daily.      Discharge Medications: Please see discharge summary for a list of discharge medications.  Relevant Imaging Results:  Relevant Lab  Results:   Additional Information soc sec [phone] [id number]  Zenon Mayo, RN

## 2019-04-17 NOTE — TOC Transition Note (Signed)
Transition of Care [facility]) - CM/SW Discharge Note   Patient Details  Name: Mary Villa MRN: [id number] Date of Birth: 1940/09/20  [facility] CM/SW Contact:  Zenon Mayo, RN Phone Number: 04/17/2019, 3:44 PM   Clinical Narrative:    Patient for dc back to [facility] today, Santiago Glad notified at [facility] [place] faxed the [place] and the dc summary.  Shirlee Limerick RN to call report to [phone].  PTAR scheduled for 3:45  Pickup.    Final next level of care: Assisted Living Barriers to Discharge: No Barriers Identified   Patient Goals and CMS Choice Patient states their goals for this hospitalization and ongoing recovery are:: return to ALF   Choice offered to / list presented to : NA  Discharge Placement                Patient to be transferred to facility by: [place] Name of family member notified: Foy [place] [phone] Patient and family notified of of transfer: 04/17/19  Discharge Plan and Services                DME Arranged: (NA)         HH Arranged: NA          Social Determinants of Health (SDOH) Interventions     Readmission Risk Interventions No flowsheet data found.

## 2019-04-17 NOTE — Plan of Care (Signed)
  Problem: Education: Goal: Knowledge of General Education information will improve Description: Including pain rating scale, medication(s)/side effects and non-pharmacologic comfort measures 04/17/2019 0059 by Rexford Maus, RN Outcome: Progressing 04/17/2019 0058 by Rexford Maus, RN Outcome: Progressing   Problem: Health Behavior/Discharge Planning: Goal: Ability to manage health-related needs will improve Outcome: Progressing   Problem: Clinical Measurements: Goal: Ability to maintain clinical measurements within normal limits will improve Outcome: Progressing   Problem: Activity: Goal: Risk for activity intolerance will decrease Outcome: Progressing   Problem: Nutrition: Goal: Adequate nutrition will be maintained Outcome: Progressing

## 2019-04-17 NOTE — Discharge Instructions (Signed)
You will need

## 2019-04-20 LAB — CULTURE, BLOOD (ROUTINE X 2)
Culture: NO GROWTH
Culture: NO GROWTH
Special Requests: ADEQUATE

## 2019-05-12 ENCOUNTER — Emergency Department (HOSPITAL_COMMUNITY)
Admission: EM | Admit: 2019-05-12 | Discharge: 2019-05-12 | Disposition: A | Discharge status: Home / Self Care | Payer: Medicare Other | Attending: Emergency Medicine | Admitting: Emergency Medicine

## 2019-05-12 ENCOUNTER — Emergency Department (HOSPITAL_COMMUNITY): Payer: Medicare Other

## 2019-05-12 DIAGNOSIS — Y999 Unspecified external cause status: Secondary | ICD-10-CM | POA: Diagnosis not present

## 2019-05-12 DIAGNOSIS — R634 Abnormal weight loss: Secondary | ICD-10-CM | POA: Insufficient documentation

## 2019-05-12 DIAGNOSIS — W19XXXA Unspecified fall, initial encounter: Secondary | ICD-10-CM

## 2019-05-12 DIAGNOSIS — Y92129 Unspecified place in nursing home as the place of occurrence of the external cause: Secondary | ICD-10-CM | POA: Diagnosis not present

## 2019-05-12 DIAGNOSIS — Y9301 Activity, walking, marching and hiking: Secondary | ICD-10-CM | POA: Insufficient documentation

## 2019-05-12 DIAGNOSIS — S2232XA Fracture of one rib, left side, initial encounter for closed fracture: Secondary | ICD-10-CM

## 2019-05-12 DIAGNOSIS — Z79899 Other long term (current) drug therapy: Secondary | ICD-10-CM | POA: Insufficient documentation

## 2019-05-12 DIAGNOSIS — W1830XA Fall on same level, unspecified, initial encounter: Secondary | ICD-10-CM | POA: Insufficient documentation

## 2019-05-12 DIAGNOSIS — F0390 Unspecified dementia without behavioral disturbance: Secondary | ICD-10-CM | POA: Insufficient documentation

## 2019-05-12 DIAGNOSIS — R0789 Other chest pain: Secondary | ICD-10-CM | POA: Diagnosis present

## 2019-05-12 LAB — COMPREHENSIVE METABOLIC PANEL
ALT: 7 U/L (ref 0–44)
AST: 31 U/L (ref 15–41)
Albumin: 3.3 g/dL — ABNORMAL LOW (ref 3.5–5.0)
Alkaline Phosphatase: 65 U/L (ref 38–126)
Anion gap: 8 (ref 5–15)
BUN: 27 mg/dL — ABNORMAL HIGH (ref 8–23)
CO2: 22 mmol/L (ref 22–32)
Calcium: 9.1 mg/dL (ref 8.9–10.3)
Chloride: 106 mmol/L (ref 98–111)
Creatinine, Ser: 1.48 mg/dL — ABNORMAL HIGH (ref 0.44–1.00)
GFR calc Af Amer: 38 mL/min — ABNORMAL LOW (ref >60)
GFR calc non Af Amer: 33 mL/min — ABNORMAL LOW (ref >60)
Glucose, Bld: 72 mg/dL (ref 70–99)
Potassium: 6.1 mmol/L — ABNORMAL HIGH (ref 3.5–5.1)
Sodium: 136 mmol/L (ref 135–145)
Total Bilirubin: 1.1 mg/dL (ref 0.3–1.2)
Total Protein: 7.3 g/dL (ref 6.5–8.1)

## 2019-05-12 LAB — CBC WITH DIFFERENTIAL/PLATELET
Abs Immature Granulocytes: 0.02 10*3/uL (ref 0.00–0.07)
Basophils Absolute: 0.1 10*3/uL (ref 0.0–0.1)
Basophils Relative: 1 %
Eosinophils Absolute: 0.2 10*3/uL (ref 0.0–0.5)
Eosinophils Relative: 3 %
HCT: 34.1 % — ABNORMAL LOW (ref 36.0–46.0)
Hemoglobin: 10.7 g/dL — ABNORMAL LOW (ref 12.0–15.0)
Immature Granulocytes: 0 %
Lymphocytes Relative: 21 %
Lymphs Abs: 1.5 10*3/uL (ref 0.7–4.0)
MCH: 27.8 pg (ref 26.0–34.0)
MCHC: 31.4 g/dL (ref 30.0–36.0)
MCV: 88.6 fL (ref 80.0–100.0)
Monocytes Absolute: 0.7 10*3/uL (ref 0.1–1.0)
Monocytes Relative: 10 %
Neutro Abs: 4.7 10*3/uL (ref 1.7–7.7)
Neutrophils Relative %: 65 %
Platelets: 248 10*3/uL (ref 150–400)
RBC: 3.85 MIL/uL — ABNORMAL LOW (ref 3.87–5.11)
RDW: 14 % (ref 11.5–15.5)
WBC: 7.2 10*3/uL (ref 4.0–10.5)
nRBC: 0 % (ref 0.0–0.2)

## 2019-05-12 LAB — POTASSIUM: Potassium: 4.1 mmol/L (ref 3.5–5.1)

## 2019-05-12 MED ORDER — ACETAMINOPHEN 500 MG PO TABS
1000.0000 mg | ORAL_TABLET | Freq: Once | ORAL | Status: AC
  Administered 2019-05-12: 19:00:00 1000 mg via ORAL
  Filled 2019-05-12: qty 2

## 2019-05-12 MED ORDER — LIDOCAINE 5 % EX PTCH: 1.0000 | MEDICATED_PATCH | CUTANEOUS | 0 refills | Status: AC

## 2019-05-12 MED ORDER — ACETAMINOPHEN 500 MG PO TABS: 500.0000 mg | ORAL_TABLET | Freq: Four times a day (QID) | ORAL | 0 refills | Status: AC | PRN

## 2019-05-12 MED ORDER — LIDOCAINE 5 % EX PTCH
1.0000 | MEDICATED_PATCH | CUTANEOUS | Status: DC
  Administered 2019-05-12: 1 via TRANSDERMAL
  Filled 2019-05-12: qty 1

## 2019-05-12 MED ORDER — DICLOFENAC SODIUM 1 % TD GEL: 2.0000 g | Freq: Four times a day (QID) | TRANSDERMAL | 1 refills | Status: AC

## 2019-05-12 NOTE — ED Provider Notes (Signed)
Patient's labs are within normal limits except for a potassium of 6.1.  However repeat potassium is normal.  CT of the abdomen, chest and pelvis show a nondisplaced 11th left rib fracture but no evidence of pulmonary contusion or other acute traumatic findings.  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  On reevaluation patient is having some pain in the left side but is able to move around in bed take deep breaths and denies any shortness of breath.  Patient's vital signs are reassuring.  Patient given Tylenol and lidocaine patches.  Case management was called in regards to this patient however when attempting to contact her daughter Jodi Marble unable to contact her at this time to give her an update.  A message was left.  The plan will be for patient to go back to her assisted living facility however she may in the next days to weeks require increased level of care as it sounds like she is not thriving in her's current situation.  Here she is drinking and eating a sandwich.  She appears in no acute distress.   Blanchie Dessert, MD 05/12/19 [phone]

## 2019-05-12 NOTE — ED Notes (Signed)
Pt to ct 

## 2019-05-12 NOTE — ED Notes (Signed)
Please call the daughter before the patient is discharge  Her name is Jodi Marble    [phone]  about nursing home place due to malnourishment

## 2019-05-12 NOTE — ED Triage Notes (Signed)
[facility] pt from [facility], pt reports her feet got tangled up while walking falling on her Left side, denies hitting her head LOC, N/V, no shortening/rotation noted. C/o Left flank pain with a small hematoma.    BP104/68 HR 78 98RA, RR 18

## 2019-05-12 NOTE — ED Notes (Signed)
Patient verbalizes understanding of discharge instructions. Opportunity for questioning and answers were provided. pt discharged from ED with PTAR. Updates called to patients home facility.

## 2019-05-12 NOTE — ED Notes (Signed)
ptar called for pt 

## 2019-05-12 NOTE — ED Triage Notes (Signed)
Pt c/o Left wrist pain. Denies pain anywhere else

## 2019-05-12 NOTE — ED Provider Notes (Signed)
[facility] EM[facility]ENC[facility] Provider Note   CSN: [id number] Arrival date & time: 05/12/19  1118     History   Chief Complaint Chief Complaint  Patient presents with  . Fall    HPI Mary Villa is a 78 y.o. female.     HPI Patient comes a Chelle manner.  Reportedly her feet got tangled up when she was walking and she fell onto her left side.  No loss of consciousness reported.  Patient denies headache.  She reports she has pain on her left side she indicates her left thoracic rib cage.  She denies pain in her wrists.  This was noted in triage note but the patient reports that her wrists have always looked unusual from old fracture and it is no more painful than usual for her.  I also spoke with the patient's adopted daughter who is her niece.  She had concerns for weight loss and malnutrition.  She was requesting that the patient have some lab work evaluated to recheck on prior symptomatic anemia.  She was also concerned that the patient might need higher level of nursing care than is provided at [facility].  This seems to be based predominantly on the patient's weight loss and nutritional needs. Past Medical History:  Diagnosis Date  . Anemia 03/2019  . Arthritis   . Asthma     Patient Active Problem List   Diagnosis Date Noted  . Protein-calorie malnutrition, severe 04/17/2019  . Dementia (HCC) 04/17/2019  . Vitamin B12 deficiency anemia, unspecified 04/16/2019  . Acute on chronic renal failure (HCC) 04/16/2019  . AKI (acute kidney injury) (HCC) 04/16/2019  . Symptomatic anemia 04/15/2019  . Anemia 08/11/2017    Past Surgical History:  Procedure Laterality Date  . KNEE ARTHROSCOPY       OB History   No obstetric history on file.      Home Medications    Prior to Admission medications   Medication Sig Start Date End Date Taking? Authorizing Provider  acetaminophen (TYLENOL) 325 MG tablet Take 650 mg by mouth every 6 (six) hours as  needed for headache (minor discomfort/ fever up to 100 degrees orally).    [provider]  acetaminophen (TYLENOL) 500 MG tablet Take 2 tablets (1,000 mg total) by mouth every 6 (six) hours as needed. Patient not taking: Reported on 11/04/2017 08/17/17   Arby BarrettePfeiffer, Sherwood Castilla, MD  ammonium lactate (LAC-HYDRIN) 12 % lotion Apply 1 application topically See admin instructions. Spread topically to feet once daily - "not between toes"    [provider]  Cholecalciferol (VITAMIN D3) 50 MCG (2000 UT) TABS Take 2,000 Units by mouth every Thursday.     [provider]  Fluticasone-Salmeterol (WIXELA INHUB) 250-50 MCG/DOSE AEPB Inhale 1 puff into the lungs 2 (two) times daily.    [provider]  mirtazapine (REMERON) 15 MG tablet Take 15 mg by mouth at bedtime. For sleep and appetite    [provider]  omeprazole (PRILOSEC) 20 MG capsule Take 20 mg by mouth daily.    [provider]  Propylene Glycol (SYSTANE COMPLETE) 0.6 % SOLN Place 1 drop into both eyes 4 (four) times daily.    [provider]  triamterene-hydrochlorothiazide (MAXZIDE-25) 37.5-25 MG tablet Take 1 tablet by mouth daily. 07/13/17   [provider]  vitamin B-12 (CYANOCOBALAMIN) 1000 MCG tablet Take 2 tablets (2,000 mcg total) by mouth daily. 04/17/19   Lanelle BalHarbrecht, Lawrence, MD    Family History  No family history on file.  Social History Social History   Tobacco Use  . Smoking status: Never Smoker  . Smokeless tobacco: Never Used  Substance Use Topics  . Alcohol use: Not Currently  . Drug use: Never     Allergies   Penicillins   Review of Systems Review of Systems 10 Systems reviewed and are negative for acute change except as noted in the HPI.  Physical Exam Updated Vital Signs BP 138/65 (BP Location: Right Arm)   Pulse (!) 57   Temp 98.7 F (37.1 C) (Oral)   Resp 16   SpO2 100%   Physical Exam Constitutional:      Comments: Patient is alert.  No  acute distress.  No respiratory distress.  HENT:     Head: Normocephalic and atraumatic.     Mouth/Throat:     Mouth: Mucous membranes are moist.     Pharynx: Oropharynx is clear.  Eyes:     Extraocular Movements: Extraocular movements intact.  Neck:     Musculoskeletal: Neck supple.  Cardiovascular:     Rate and Rhythm: Normal rate and regular rhythm.  Pulmonary:     Effort: Pulmonary effort is normal.     Breath sounds: Normal breath sounds.     Comments: Left chest wall tender to palpation.  No appreciable crepitus. Chest:     Chest wall: Tenderness present.  Abdominal:     General: There is no distension.     Palpations: Abdomen is soft.     Tenderness: There is no abdominal tenderness.  Musculoskeletal:     Comments: Patient has extensive arthritic and bony changes at the wrists.  However, this is nontender.  No deformities of the lower extremities.  Patient is able to sit up on the stretcher and transfer herself to a wheelchair.  This seemed to only elicit pain in her left thoracic region.  Skin:    General: Skin is warm and dry.  Neurological:     General: No focal deficit present.     Coordination: Coordination normal.  Psychiatric:        Mood and Affect: Mood normal.      ED Treatments / Results  Labs (all labs ordered are listed, but only abnormal results are displayed) Labs Reviewed  CBC WITH DIFFERENTIAL/PLATELET  COMPREHENSIVE METABOLIC PANEL  URINALYSIS, ROUTINE W REFLEX MICROSCOPIC    EKG None  Radiology No results found.  Procedures Procedures (including critical care time)  Medications Ordered in ED Medications - No data to display   Initial Impression / Assessment and Plan / ED Course  I have reviewed the triage vital signs and the nursing notes.  Pertinent labs & imaging results that were available during my care of the patient were reviewed by me and considered in my medical decision making (see chart for details).       Patient  had fall which is described as a mechanical fall.  Her mental status is clear.  There is no reported loss of consciousness and patient is not Dors headache.  Chest x-ray identified potential left rib fracture.  Will proceed with CT scanning to rule out any solid organ injury from her fall.  We will also obtain basic lab work for monitoring of prior anemia and AKI.  Dr. Maryan Rued will review results for final disposition.  I have reviewed the case with social work to contact the patient's family regarding concerns for [facility] level of [facility] at [facility].  Also I had  a conversation with the patient's primary contact, her niece who is adopted daughter.  We reviewed the plan.  Final Clinical Impressions(s) / ED Diagnoses   Final diagnoses:  Fall, initial encounter  Closed fracture of one rib of left side, initial encounter    ED Discharge Orders    None       Arby Barrette, MD 05/12/19 [phone]

## 2019-05-12 NOTE — Discharge Instructions (Signed)
On your CAT scan today you had 1 fracture of your left 11th rib.  For the next few weeks it will be tender.  You should take Tylenol and use the lidocaine patches to help with the pain.  It would be best for you to see your doctor in the next week for a recheck to make sure you are doing well.  If the became becomes severe or you start having issues breathing you should return to the hospital immediately.

## 2020-07-24 ENCOUNTER — Emergency Department (HOSPITAL_COMMUNITY): Payer: Medicare Other

## 2020-07-24 ENCOUNTER — Encounter (HOSPITAL_COMMUNITY): Payer: Self-pay | Admitting: Emergency Medicine

## 2020-07-24 ENCOUNTER — Other Ambulatory Visit: Payer: Self-pay

## 2020-07-24 ENCOUNTER — Emergency Department (HOSPITAL_COMMUNITY)
Admission: EM | Admit: 2020-07-24 | Discharge: 2020-07-24 | Disposition: A | Discharge status: Home / Self Care | Payer: Medicare Other | Attending: Emergency Medicine | Admitting: Emergency Medicine

## 2020-07-24 DIAGNOSIS — J45909 Unspecified asthma, uncomplicated: Secondary | ICD-10-CM | POA: Diagnosis not present

## 2020-07-24 DIAGNOSIS — W010XXA Fall on same level from slipping, tripping and stumbling without subsequent striking against object, initial encounter: Secondary | ICD-10-CM | POA: Diagnosis not present

## 2020-07-24 DIAGNOSIS — M542 Cervicalgia: Secondary | ICD-10-CM | POA: Insufficient documentation

## 2020-07-24 DIAGNOSIS — M25531 Pain in right wrist: Secondary | ICD-10-CM | POA: Insufficient documentation

## 2020-07-24 DIAGNOSIS — N189 Chronic kidney disease, unspecified: Secondary | ICD-10-CM | POA: Insufficient documentation

## 2020-07-24 DIAGNOSIS — Z7951 Long term (current) use of inhaled steroids: Secondary | ICD-10-CM | POA: Diagnosis not present

## 2020-07-24 DIAGNOSIS — Z79899 Other long term (current) drug therapy: Secondary | ICD-10-CM | POA: Diagnosis not present

## 2020-07-24 DIAGNOSIS — F0390 Unspecified dementia without behavioral disturbance: Secondary | ICD-10-CM | POA: Diagnosis not present

## 2020-07-24 DIAGNOSIS — Y92099 Unspecified place in other non-institutional residence as the place of occurrence of the external cause: Secondary | ICD-10-CM | POA: Diagnosis not present

## 2020-07-24 DIAGNOSIS — M25532 Pain in left wrist: Secondary | ICD-10-CM | POA: Insufficient documentation

## 2020-07-24 DIAGNOSIS — W19XXXA Unspecified fall, initial encounter: Secondary | ICD-10-CM

## 2020-07-24 MED ORDER — ACETAMINOPHEN 500 MG PO TABS
1000.0000 mg | ORAL_TABLET | Freq: Once | ORAL | Status: AC
  Administered 2020-07-24: 1000 mg via ORAL
  Filled 2020-07-24: qty 2

## 2020-07-24 NOTE — ED Provider Notes (Signed)
[facility]-EMERGENCY DEPT Provider Note   CSN: [id number] Arrival date & time: 07/24/20  0436     History Chief Complaint  Patient presents with  . Fall    Mary Villa is a 79 y.o. female.  79 yo F with a chief complaints of a fall.  Patient states that she was getting ready to go to bed and she slipped and fell and something wet.  She is complaining of pain to her left and right wrist and to her right neck.  She is unsure if she struck her head.  Denies lower extremity pain.  Denies pain in the hips.  Denies pain in the back.  Denies chest pain or shortness of breath.  Denies abdominal pain.  The history is provided by the patient.  Fall This is a new problem. The current episode started 1 to 2 hours ago. The problem occurs rarely. The problem has been resolved. Pertinent negatives include no chest pain, no headaches and no shortness of breath. Nothing aggravates the symptoms. Nothing relieves the symptoms. She has tried nothing for the symptoms. The treatment provided no relief.       Past Medical History:  Diagnosis Date  . Anemia 03/2019  . Arthritis   . Asthma     Patient Active Problem List   Diagnosis Date Noted  . Protein-calorie malnutrition, severe 04/17/2019  . Dementia (HCC) 04/17/2019  . Vitamin B12 deficiency anemia, unspecified 04/16/2019  . Acute on chronic renal failure (HCC) 04/16/2019  . AKI (acute kidney injury) (HCC) 04/16/2019  . Symptomatic anemia 04/15/2019  . Anemia 08/11/2017    Past Surgical History:  Procedure Laterality Date  . KNEE ARTHROSCOPY       OB History   No obstetric history on file.     History reviewed. No pertinent family history.  Social History   Tobacco Use  . Smoking status: Never Smoker  . Smokeless tobacco: Never Used  Vaping Use  . Vaping Use: Never used  Substance Use Topics  . Alcohol use: Not Currently  . Drug use: Never    Home Medications Prior to Admission medications    Medication Sig Start Date End Date Taking? Authorizing Provider  acetaminophen (TYLENOL) 500 MG tablet Take 1 tablet (500 mg total) by mouth every 6 (six) hours as needed for moderate pain (for rib fracture). 05/12/19   Gwyneth Sprout, MD  ammonium lactate (LAC-HYDRIN) 12 % lotion Apply 1 application topically See admin instructions. Spread topically to feet once daily - "not between toes"    [provider]  Cholecalciferol (VITAMIN D3) 50 MCG (2000 UT) TABS Take 2,000 Units by mouth every Thursday.     [provider]  diclofenac sodium (VOLTAREN) 1 % GEL Apply 2 g topically 4 (four) times daily. 05/12/19   Gwyneth Sprout, MD  Fluticasone-Salmeterol (WIXELA INHUB) 250-50 MCG/DOSE AEPB Inhale 1 puff into the lungs 2 (two) times daily.    [provider]  lidocaine (LIDODERM) 5 % Place 1 patch onto the skin daily. Remove & Discard patch within 12 hours or as directed by MD 05/12/19   Gwyneth Sprout, MD  mirtazapine (REMERON) 15 MG tablet Take 15 mg by mouth at bedtime. For sleep and appetite    [provider]  omeprazole (PRILOSEC) 20 MG capsule Take 20 mg by mouth daily.    [provider]  Propylene Glycol (SYSTANE COMPLETE) 0.6 % SOLN Place 1 drop into both eyes 4 (four) times daily.  [provider]  triamterene-hydrochlorothiazide (MAXZIDE-25) 37.5-25 MG tablet Take 1 tablet by mouth daily. 07/13/17   [provider]  vitamin B-12 (CYANOCOBALAMIN) 1000 MCG tablet Take 2 tablets (2,000 mcg total) by mouth daily. 04/17/19   Lanelle BalHarbrecht, Lawrence, MD    Allergies    Penicillins  Review of Systems   Review of Systems  Constitutional: Negative for chills and fever.  HENT: Negative for congestion and rhinorrhea.   Eyes: Negative for redness and visual disturbance.  Respiratory: Negative for shortness of breath and wheezing.   Cardiovascular: Negative for chest pain and palpitations.  Gastrointestinal: Negative for nausea and  vomiting.  Genitourinary: Negative for dysuria and urgency.  Musculoskeletal: Positive for arthralgias, myalgias and neck pain.  Skin: Negative for pallor and wound.  Neurological: Negative for dizziness and headaches.    Physical Exam Updated Vital Signs BP (!) 145/75 (BP Location: Left Arm)   Pulse 73   Temp 98.2 F (36.8 C) (Oral)   Resp 14   Ht 5\' 2"  (1.575 m)   Wt 42.6 kg   SpO2 99%   BMI 17.19 kg/m   Physical Exam Vitals and nursing note reviewed.  Constitutional:      General: She is not in acute distress.    Appearance: She is well-developed and well-nourished. She is not diaphoretic.  HENT:     Head: Normocephalic and atraumatic.  Eyes:     Extraocular Movements: EOM normal.     Pupils: Pupils are equal, round, and reactive to light.  Cardiovascular:     Rate and Rhythm: Normal rate and regular rhythm.     Heart sounds: No murmur heard. No friction rub. No gallop.   Pulmonary:     Effort: Pulmonary effort is normal.     Breath sounds: No wheezing or rales.  Abdominal:     General: There is no distension.     Palpations: Abdomen is soft.     Tenderness: There is no abdominal tenderness.  Musculoskeletal:        General: No tenderness or edema.     Cervical back: Normal range of motion and neck supple.     Comments: Kyphotic.  No obvious midline step-offs or deformities.  No reproducible pain to the C-spine but complaining of pain to the right posterior lateral aspect of the neck.  Pain and deformity to the left wrist that appears chronic.  Pulse motor and sensation intact.  Palpated from head to toe without any other noted areas of bony tenderness.  Skin:    General: Skin is warm and dry.  Neurological:     Mental Status: She is alert.     Comments: Pleasantly confused  Psychiatric:        Mood and Affect: Mood and affect normal.        Behavior: Behavior normal.     ED Results / Procedures / Treatments   Labs (all labs ordered are listed, but only  abnormal results are displayed) Labs Reviewed - No data to display  EKG None  Radiology DG Wrist Complete Left  Result Date: 07/24/2020 CLINICAL DATA:  Fall with bilateral wrist pain. EXAM: LEFT WRIST - COMPLETE 3+ VIEW COMPARISON:  11/04/2017 FINDINGS: Severe collapse of the wrist and carpus with extensive distal radioulnar joint erosion. There is extensive bony loss to the carpus which has progressed. These are findings of an inflammatory arthropathy. No available history of carpectomy. Marked osteopenia. No superimposed acute fracture. 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 No acute superimposed finding. Electronically Signed   By: Marnee Spring M.D.   On: 07/24/2020 06:02   DG Wrist Complete Right  Result Date: 07/24/2020 CLINICAL DATA:  Wrist pain.  Injury. EXAM: RIGHT WRIST - COMPLETE 3+ VIEW COMPARISON:  09/2017. FINDINGS: Diffuse osteopenia. Prominent erosive changes are again noted throughout the right wrist. Stable deformity of the carpals. These findings are again most consistent with inflammatory arthropathy such as rheumatoid arthritis. Similar findings noted on prior study. Stable subluxation of the first carpal metacarpal joint. Tiny corticated bony density again noted adjacent to the distal ulna, most likely old tiny fracture fragment. No evidence of acute fracture or dislocation. Soft tissue prominence again noted along the dorsum of the wrist without interim change. No radiopaque foreign body. IMPRESSION: 1. No acute abnormality identified. No evidence of acute fracture or dislocation. 2. Prominent erosive changes again noted throughout the right wrist consistent with inflammatory arthropathy such as rheumatoid arthritis. Similar findings noted on prior exam. Electronically Signed   By: Maisie Fus  Register   On: 07/24/2020 05:54   CT Head Wo Contrast  Result Date: 07/24/2020 CLINICAL DATA:  Unwitnessed fall. EXAM: CT  HEAD WITHOUT CONTRAST TECHNIQUE: Contiguous axial images were obtained from the base of the skull through the vertex without intravenous contrast. COMPARISON:  None. FINDINGS: Brain: Age related cerebral atrophy, ventriculomegaly and periventricular white matter disease. No extra-axial fluid collections are identified. No CT findings for acute hemispheric infarction or intracranial hemorrhage. No mass lesions. The brainstem and cerebellum are normal. Vascular: Vascular calcifications but no aneurysm or hyperdense vessels. Skull: No skull fracture or worrisome bone lesions. Sinuses/Orbits: Extensive sinus disease. The maxillary sinuses are opacified and there are some areas of high attenuation or calcification along with mild sinus wall thickening suggesting chronic disease. Also, the left half of the sphenoid sinus is expanded and opacified and there is marked thickening of the sinus wall also consistent with chronic sinus disease. Scattered ethmoid air cell opacification is noted along with frontal sinus disease. Left-sided mastoid effusions are noted. Other: No scalp lesions or scalp hematoma. IMPRESSION: 1. Age related cerebral atrophy, ventriculomegaly and periventricular white matter disease. 2. No acute intracranial findings or skull fracture. 3. Extensive paranasal sinus disease. Electronically Signed   By: Rudie Meyer M.D.   On: 07/24/2020 05:54   CT Cervical Spine Wo Contrast  Result Date: 07/24/2020 CLINICAL DATA:  Larey Seat. EXAM: CT CERVICAL SPINE WITHOUT CONTRAST TECHNIQUE: Multidetector CT imaging of the cervical spine was performed without intravenous contrast. Multiplanar CT image reconstructions were also generated. COMPARISON:  None. FINDINGS: Alignment: Normal Skull base and vertebrae: No acute fracture. No primary bone lesion or focal pathologic process. Soft tissues and spinal canal: No prevertebral fluid or swelling. No visible canal hematoma. Disc levels: The spinal canal is generous. No  large disc protrusions, spinal or foraminal stenosis. Upper chest: The visualized lung apices are grossly clear. Mild tracheomegaly noted. Other: No neck mass or hematoma. IMPRESSION: Normal alignment and no acute cervical spine fracture. Electronically Signed   By: Rudie Meyer M.D.   On: 07/24/2020 05:57    Procedures Procedures (including critical care time)  Medications Ordered in ED Medications  acetaminophen (TYLENOL) tablet 1,000 mg (has no administration in time range)    ED Course  I have reviewed the triage vital signs and the nursing notes.  Pertinent labs & imaging results that were available during my care of the patient were reviewed by me and considered  in my medical decision making (see chart for details).    MDM Rules/Calculators/A&P                          79 yo F with a chief complaints of a fall.  This occurred at her skilled nursing facility.  She is complaining of right-sided neck pain and bilateral wrist pain.  Will obtain a CT scan of the head and C-spine.  Plain films of bilateral wrist.  Reassess.  Plain films viewed by me with what appears to be chronic changes of the wrist anatomy.  No obvious fracture or change when compared to prior films as viewed by me.  CT of the head and C-spine read as negative for acute anterior cranial or acute injury to the C-spine.  Will discharge the patient home.  PCP follow-up.  6:07 AM:  I have discussed the diagnosis/risks/treatment options with the patient and believe the pt to be eligible for discharge home to follow-up with PCP. We also discussed returning to the ED immediately if new or worsening sx occur. We discussed the sx which are most concerning (e.g., sudden worsening pain, fever, inability to tolerate by mouth) that necessitate immediate return. Medications administered to the patient during their visit and any new prescriptions provided to the patient are listed below.  Medications given during this  visit Medications  acetaminophen (TYLENOL) tablet 1,000 mg (has no administration in time range)     The patient appears reasonably screen and/or stabilized for discharge and I doubt any other medical condition or other [facility] requiring further screening, evaluation, or treatment in the ED at this time prior to discharge.   Final Clinical Impression(s) / ED Diagnoses Final diagnoses:  Fall, initial encounter  Wrist pain, acute, left    Rx / DC Orders ED Discharge Orders    None       Melene Plan, DO 07/24/20 [phone]

## 2020-07-24 NOTE — ED Triage Notes (Signed)
Patient had unwitnessed fall at [facility]. Patient is complaining of neck pain, left wrist pain, and left knee pain. Patient has a hx of dementia.

## 2020-07-24 NOTE — Discharge Instructions (Signed)
Follow up with your family doc.  °

## 2021-08-02 ENCOUNTER — Encounter (HOSPITAL_COMMUNITY): Payer: Self-pay

## 2021-08-02 ENCOUNTER — Emergency Department (HOSPITAL_COMMUNITY)
Admission: EM | Admit: 2021-08-02 | Discharge: 2021-08-03 | Disposition: A | Discharge status: Skilled Nursing Facility | Attending: Emergency Medicine | Admitting: Emergency Medicine

## 2021-08-02 ENCOUNTER — Emergency Department (HOSPITAL_COMMUNITY)

## 2021-08-02 ENCOUNTER — Other Ambulatory Visit: Payer: Self-pay

## 2021-08-02 DIAGNOSIS — N189 Chronic kidney disease, unspecified: Secondary | ICD-10-CM | POA: Diagnosis not present

## 2021-08-02 DIAGNOSIS — S3992XA Unspecified injury of lower back, initial encounter: Secondary | ICD-10-CM | POA: Diagnosis present

## 2021-08-02 DIAGNOSIS — F0390 Unspecified dementia without behavioral disturbance: Secondary | ICD-10-CM | POA: Insufficient documentation

## 2021-08-02 DIAGNOSIS — W19XXXA Unspecified fall, initial encounter: Secondary | ICD-10-CM

## 2021-08-02 DIAGNOSIS — W1839XA Other fall on same level, initial encounter: Secondary | ICD-10-CM | POA: Insufficient documentation

## 2021-08-02 DIAGNOSIS — J45909 Unspecified asthma, uncomplicated: Secondary | ICD-10-CM | POA: Diagnosis not present

## 2021-08-02 DIAGNOSIS — S79919A Unspecified injury of unspecified hip, initial encounter: Secondary | ICD-10-CM | POA: Insufficient documentation

## 2021-08-02 LAB — I-STAT CHEM 8, ED
BUN: 24 mg/dL — ABNORMAL HIGH (ref 8–23)
Calcium, Ion: 1.17 mmol/L (ref 1.15–1.40)
Chloride: 105 mmol/L (ref 98–111)
Creatinine, Ser: 1.3 mg/dL — ABNORMAL HIGH (ref 0.44–1.00)
Glucose, Bld: 84 mg/dL (ref 70–99)
HCT: 30 % — ABNORMAL LOW (ref 36.0–46.0)
Hemoglobin: 10.2 g/dL — ABNORMAL LOW (ref 12.0–15.0)
Potassium: 3.8 mmol/L (ref 3.5–5.1)
Sodium: 140 mmol/L (ref 135–145)
TCO2: 26 mmol/L (ref 22–32)

## 2021-08-02 LAB — CBC WITH DIFFERENTIAL/PLATELET
Abs Immature Granulocytes: 0.03 10*3/uL (ref 0.00–0.07)
Basophils Absolute: 0.1 10*3/uL (ref 0.0–0.1)
Basophils Relative: 1 %
Eosinophils Absolute: 0.2 10*3/uL (ref 0.0–0.5)
Eosinophils Relative: 4 %
HCT: 30 % — ABNORMAL LOW (ref 36.0–46.0)
Hemoglobin: 8.6 g/dL — ABNORMAL LOW (ref 12.0–15.0)
Immature Granulocytes: 1 %
Lymphocytes Relative: 21 %
Lymphs Abs: 1.3 10*3/uL (ref 0.7–4.0)
MCH: 22.5 pg — ABNORMAL LOW (ref 26.0–34.0)
MCHC: 28.7 g/dL — ABNORMAL LOW (ref 30.0–36.0)
MCV: 78.3 fL — ABNORMAL LOW (ref 80.0–100.0)
Monocytes Absolute: 0.7 10*3/uL (ref 0.1–1.0)
Monocytes Relative: 11 %
Neutro Abs: 3.7 10*3/uL (ref 1.7–7.7)
Neutrophils Relative %: 62 %
Platelets: 309 10*3/uL (ref 150–400)
RBC: 3.83 MIL/uL — ABNORMAL LOW (ref 3.87–5.11)
RDW: 13.4 % (ref 11.5–15.5)
WBC: 6 10*3/uL (ref 4.0–10.5)
nRBC: 0 % (ref 0.0–0.2)

## 2021-08-02 LAB — BASIC METABOLIC PANEL
Anion gap: 9 (ref 5–15)
BUN: 26 mg/dL — ABNORMAL HIGH (ref 8–23)
CO2: 24 mmol/L (ref 22–32)
Calcium: 9.1 mg/dL (ref 8.9–10.3)
Chloride: 105 mmol/L (ref 98–111)
Creatinine, Ser: 1.22 mg/dL — ABNORMAL HIGH (ref 0.44–1.00)
GFR, Estimated: 44 mL/min — ABNORMAL LOW (ref >60)
Glucose, Bld: 86 mg/dL (ref 70–99)
Potassium: 3.8 mmol/L (ref 3.5–5.1)
Sodium: 138 mmol/L (ref 135–145)

## 2021-08-02 LAB — CK: Total CK: 44 U/L (ref 38–234)

## 2021-08-02 MED ORDER — SODIUM CHLORIDE 0.9 % IV BOLUS
1000.0000 mL | Freq: Once | INTRAVENOUS | Status: AC
  Administered 2021-08-02: 22:00:00 1000 mL via INTRAVENOUS

## 2021-08-02 MED ORDER — FENTANYL CITRATE PF 50 MCG/ML IJ SOSY
25.0000 ug | PREFILLED_SYRINGE | Freq: Once | INTRAMUSCULAR | Status: AC
  Administered 2021-08-02: 22:00:00 25 ug via INTRAVENOUS
  Filled 2021-08-02: qty 1

## 2021-08-02 NOTE — Discharge Instructions (Signed)
Your CT scan and x-rays did not show any fractures or bleeding  See your doctor for follow-up  Fall precautions at the facility.   Return to ER if you have another fall, lethargy, vomiting

## 2021-08-02 NOTE — ED Provider Notes (Signed)
[facility]   CSN: [id number] Arrival date & time: 08/02/21  1950     History Chief Complaint  Patient presents with   Mary Villa is a 80 y.o. female history of dementia on hospice, here presenting with fall.  Patient is demented and was found on the floor.  Patient is unable to tell me how she fell.  She was seen here about a week ago after a fall and had unremarkable CT head and cervical spine and x-rays.  Patient was initially complaining of back and hip pain.  Patient unable to give me any meaningful history and appears to be in pain in general.  The history is provided by the EMS personnel.  Level V caveat- dementia     Past Medical History:  Diagnosis Date   Anemia 03/2019   Arthritis    Asthma     Patient Active Problem List   Diagnosis Date Noted   Protein-calorie malnutrition, severe 04/17/2019   Dementia (HCC) 04/17/2019   Vitamin B12 deficiency anemia, unspecified 04/16/2019   Acute on chronic renal failure (HCC) 04/16/2019   AKI (acute kidney injury) (HCC) 04/16/2019   Symptomatic anemia 04/15/2019   Anemia 08/11/2017    Past Surgical History:  Procedure Laterality Date   KNEE ARTHROSCOPY       OB History   No obstetric history on file.     History reviewed. No pertinent family history.  Social History   Tobacco Use   Smoking status: Never   Smokeless tobacco: Never  Vaping Use   Vaping Use: Never used  Substance Use Topics   Alcohol use: Not Currently   Drug use: Never    Home Medications Prior to Admission medications   Medication Sig Start Date End Date Taking? Authorizing Provider  acetaminophen (TYLENOL) 500 MG tablet Take 1 tablet (500 mg total) by mouth every 6 (six) hours as needed for moderate pain (for rib fracture). 05/12/19   Gwyneth Sprout, MD  ammonium lactate (LAC-HYDRIN) 12 % lotion Apply 1 application topically See admin instructions. Spread topically to feet  once daily - "not between toes"    [provider]  Cholecalciferol (VITAMIN D3) 50 MCG (2000 UT) TABS Take 2,000 Units by mouth every Thursday.     [provider]  diclofenac sodium (VOLTAREN) 1 % GEL Apply 2 g topically 4 (four) times daily. 05/12/19   Gwyneth Sprout, MD  Fluticasone-Salmeterol (WIXELA INHUB) 250-50 MCG/DOSE AEPB Inhale 1 puff into the lungs 2 (two) times daily.    [provider]  lidocaine (LIDODERM) 5 % Place 1 patch onto the skin daily. Remove & Discard patch within 12 hours or as directed by MD 05/12/19   Gwyneth Sprout, MD  mirtazapine (REMERON) 15 MG tablet Take 15 mg by mouth at bedtime. For sleep and appetite    [provider]  omeprazole (PRILOSEC) 20 MG capsule Take 20 mg by mouth daily.    [provider]  Propylene Glycol (SYSTANE COMPLETE) 0.6 % SOLN Place 1 drop into both eyes 4 (four) times daily.    [provider]  triamterene-hydrochlorothiazide (MAXZIDE-25) 37.5-25 MG tablet Take 1 tablet by mouth daily. 07/13/17   [provider]  vitamin B-12 (CYANOCOBALAMIN) 1000 MCG tablet Take 2 tablets (2,000 mcg total) by mouth daily. 04/17/19   Lanelle Bal, MD    Allergies    Penicillins  Review of Systems   Review of Systems  Unable to perform  ROS: Dementia  All other systems reviewed and are negative.  Physical Exam Updated Vital Signs BP 117/79 (BP Location: Left Arm)    Pulse 70    Temp 98.3 F (36.8 C) (Oral)    Resp 16    Ht  (1.575 m)    Wt 42.6 kg    SpO2 96%    BMI 17.19 kg/m   Physical Exam Vitals and nursing note reviewed.  Constitutional:      Comments: Chronically ill, appears uncomfortable, contracted which is baseline  HENT:     Head:     Comments: Questionable posterior scalp hematoma.  No obvious laceration    Nose: Nose normal.     Mouth/Throat:     Mouth: Mucous membranes are dry.  Eyes:     Extraocular Movements: Extraocular movements intact.      Pupils: Pupils are equal, round, and reactive to light.  Neck:     Comments: C-collar in place Cardiovascular:     Rate and Rhythm: Normal rate and regular rhythm.     Pulses: Normal pulses.     Heart sounds: Normal heart sounds.  Pulmonary:     Effort: Pulmonary effort is normal.     Breath sounds: Normal breath sounds.  Abdominal:     General: Abdomen is flat.     Palpations: Abdomen is soft.  Musculoskeletal:     Comments: No obvious sacral decub ulcers.  Able to range bilateral hips.  No obvious midline tenderness or deformity.  No obvious extremity trauma  Skin:    Capillary Refill: Capillary refill takes less than 2 seconds.  Neurological:     Comments: Patient is contracted and unable to follow commands.  Patient appears to be moving all extremities  Psychiatric:     Comments: Unable     ED Results / Procedures / Treatments   Labs (all labs ordered are listed, but only abnormal results are displayed) Labs Reviewed  CBC WITH DIFFERENTIAL/PLATELET - Abnormal; Notable for the following components:      Result Value   RBC 3.83 (*)    Hemoglobin 8.6 (*)    HCT 30.0 (*)    MCV 78.3 (*)    MCH 22.5 (*)    MCHC 28.7 (*)    All other components within normal limits  BASIC METABOLIC PANEL - Abnormal; Notable for the following components:   BUN 26 (*)    Creatinine, Ser 1.22 (*)    GFR, Estimated 44 (*)    All other components within normal limits  I-STAT CHEM 8, ED - Abnormal; Notable for the following components:   BUN 24 (*)    Creatinine, Ser 1.30 (*)    Hemoglobin 10.2 (*)    HCT 30.0 (*)    All other components within normal limits  CK    EKG None  Radiology CT HEAD WO CONTRAST ( )  Result Date: 08/02/2021 CLINICAL DATA:  Head trauma.  Fall. EXAM: CT HEAD WITHOUT CONTRAST TECHNIQUE: Contiguous axial images were obtained from the base of the skull through the vertex without intravenous contrast. COMPARISON:  CT head 07/24/2020. FINDINGS: Brain: No evidence  of acute infarction, hemorrhage, hydrocephalus, extra-axial collection or mass lesion/mass effect. There is moderate diffuse atrophy, unchanged. There is stable mild periventricular white matter hypodensity, likely chronic small vessel ischemic change. Vascular: No hyperdense vessel or unexpected calcification. Skull: Normal. Negative for fracture or focal lesion. Sinuses/Orbits: There is complete opacification of the left sphenoid sinus, right frontal sinus and bilateral maxillary  sinuses. There is some hyperdensity within the bilateral maxillary sinuses and left sphenoid sinus. The mastoid air cells are clear. Other: None. IMPRESSION: 1. No acute intracranial process. 2. Bilateral maxillary, sphenoid and right frontal sinusitis. Hyperdense material in the sinus can be seen with inspissated secretions or fungal infection. Electronically Signed   By: Darliss Cheney M.D.   On: 08/02/2021 22:02   CT Cervical Spine Wo Contrast  Result Date: 08/02/2021 CLINICAL DATA:  Fall, dementia, neck trauma EXAM: CT CERVICAL SPINE WITHOUT CONTRAST TECHNIQUE: Multidetector CT imaging of the cervical spine was performed without intravenous contrast. Multiplanar CT image reconstructions were also generated. COMPARISON:  None. FINDINGS: Alignment: Static 2 mm retrolisthesis of C2 upon C3 and C3 upon C4. Otherwise normal cervical lordosis. Skull base and vertebrae: Craniocervical alignment is normal. The atlantodental interval is not widened. Ankylosis of the anterior arch of C1 and the odontoid process. As well as the right lateral atlantoaxial articulation. No acute fracture of the cervical spine. Ankylosis of the left C6-7 facet joint and right C3-4 facet joint. Soft tissues and spinal canal: No prevertebral fluid or swelling. No visible canal hematoma. Disc levels: Intervertebral disc space narrowing at C3-4 and C6-7 is in keeping with mild to moderate degenerative disc disease. Remaining intervertebral disc heights are  preserved. The prevertebral soft tissues are not thickened on sagittal reformats. The spinal canal is widely patent. Review of the axial images demonstrates no significant neuroforaminal narrowing. Upper chest: Mild biapical scarring. Other: 21 mm left thyroid nodule. IMPRESSION: No acute fracture or listhesis of the cervical spine. 21 mm left thyroid nodule. Recommend thyroid US (ref: J Am Coll Radiol. 2015 Feb;12(2): 143-50). Electronically Signed   By: Helyn Numbers M.D.   On: 08/02/2021 22:14   DG Pelvis Portable  Result Date: 08/02/2021 CLINICAL DATA:  Fall EXAM: PORTABLE PELVIS 1-2 VIEWS COMPARISON:  None. FINDINGS: There is no evidence of pelvic fracture or diastasis. No pelvic bone lesions are seen. IMPRESSION: Negative. Electronically Signed   By: Helyn Numbers M.D.   On: 08/02/2021 21:15   DG Chest Port 1 View  Result Date: 08/02/2021 CLINICAL DATA:  Status post fall. EXAM: PORTABLE CHEST 1 VIEW COMPARISON:  May 12, 2019 FINDINGS: The lungs are mildly hyperinflated. There is no evidence of acute infiltrate, pleural effusion or pneumothorax. Mild, stable elevation of the left hemidiaphragm is seen. The heart size and mediastinal contours are within normal limits. Radiopaque surgical clips are seen along the lateral aspect of the upper chest wall, bilaterally. No acute osseous abnormalities are identified. IMPRESSION: No active cardiopulmonary disease. Electronically Signed   By: Aram Candela M.D.   On: 08/02/2021 21:16    Procedures Procedures   Medications Ordered in ED Medications  sodium chloride 0.9 % bolus 1,000 mL (1,000 mLs Intravenous New Bag/Given 08/02/21 2152)  fentaNYL (SUBLIMAZE) injection 25 mcg (25 mcg Intravenous Given 08/02/21 2152)    ED Course  I have reviewed the triage vital signs and the nursing notes.  Pertinent labs & imaging results that were available during my care of the patient were reviewed by me and considered in my medical decision making  (see chart for details).    MDM Rules/Calculators/A&P                         \Annabelle NAJLA AUGHENBAUGH is a 80 y.o. female here presenting with fall.  Patient was found on the floor.  Patient unable to give history.  We will get  CT head and neck and will get basic blood work.  Patient is on hospice  11:07 PM Labs at baseline.  CT head and neck and x-rays unremarkable.  Stable for discharge back to facility     Final Clinical Impression(s) / ED Diagnoses Final diagnoses:  None    Rx / DC Orders ED Discharge Orders     None        Charlynne Pander, MD 08/02/21 2307

## 2021-08-02 NOTE — ED Triage Notes (Signed)
Pt. BIB [facility] from [facility] care facility chief complaint of a fall. EMS denies head injury. Pt. Not on blood thinners. C/O low back pain and hip pain. History of dementia.    EMS VS:  HR: 70 BP: 100/70 RR: 16

## 2021-08-03 NOTE — ED Notes (Signed)
[facility] called for pt. Transportation. ?

## 2021-09-01 ENCOUNTER — Encounter (HOSPITAL_COMMUNITY): Payer: Self-pay | Admitting: Radiology

## 2022-09-16 DEATH — deceased

## 2023-05-25 IMAGING — CT CT HEAD W/O CM
3 series · 15 of 47 positions shown, 18 images · non-contrast
Comparison: CT head 07/24/2020.

CLINICAL DATA: Head trauma.  Fall.

EXAM:
CT HEAD WITHOUT CONTRAST
TECHNIQUE: Contiguous axial images were obtained from the base of the skull
through the vertex without intravenous contrast.

[Series 3: head wo · axial · 0.44mm/px · z∈[-138,-14]mm · 9 of 31 slices shown, 12 images]
[im 3/31  brain]
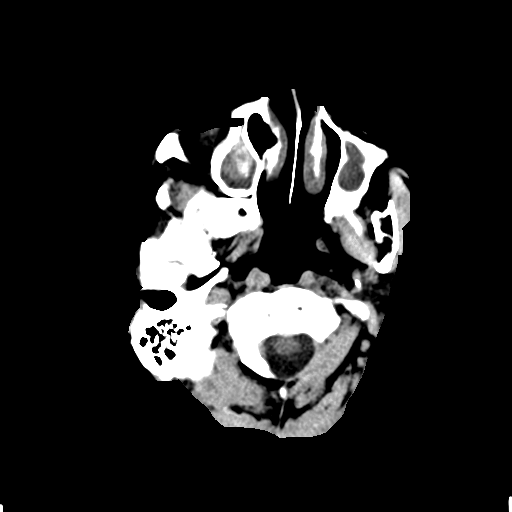
[im 3/31  bone]
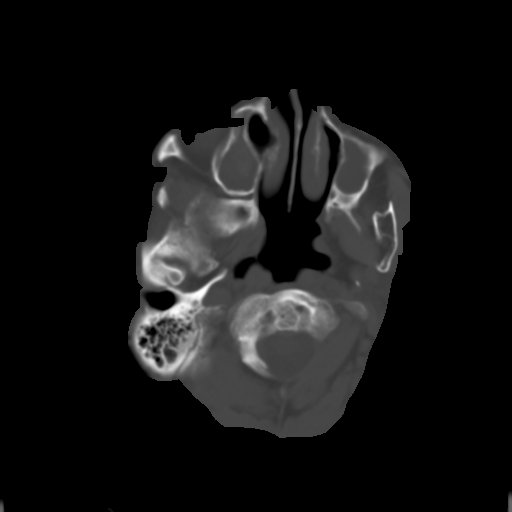
[im 6/31  brain]
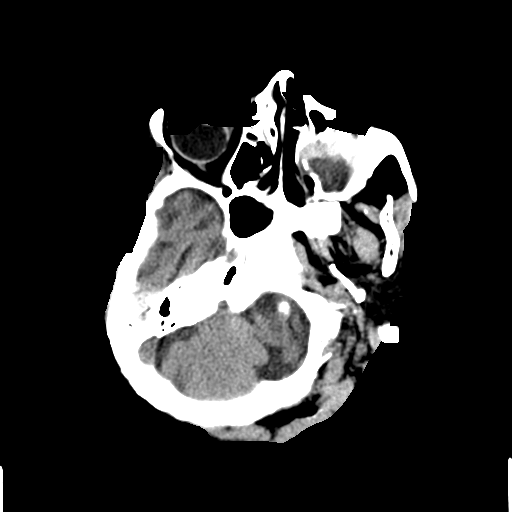
[im 9/31  brain]
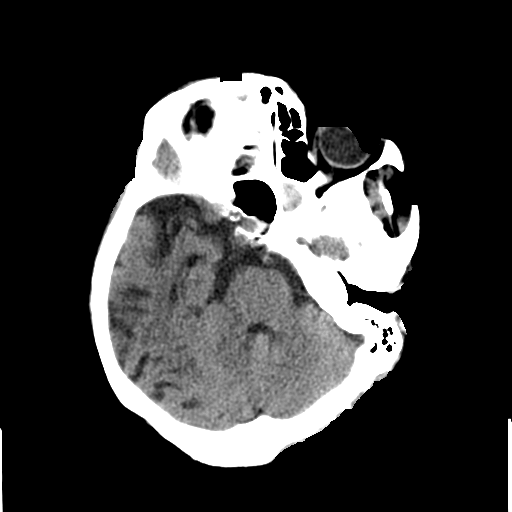
[im 12/31  brain]
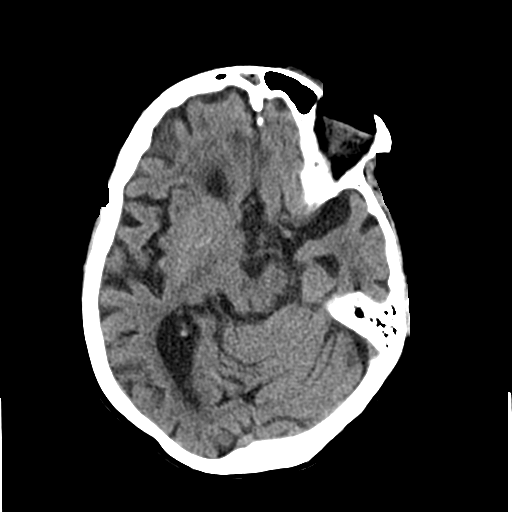
[im 16/31  brain]
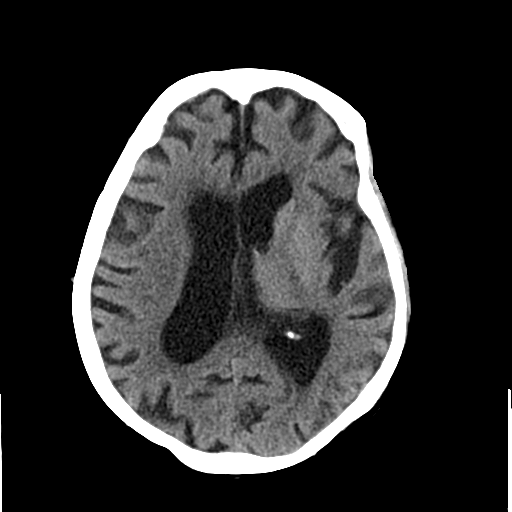
[im 16/31  bone]
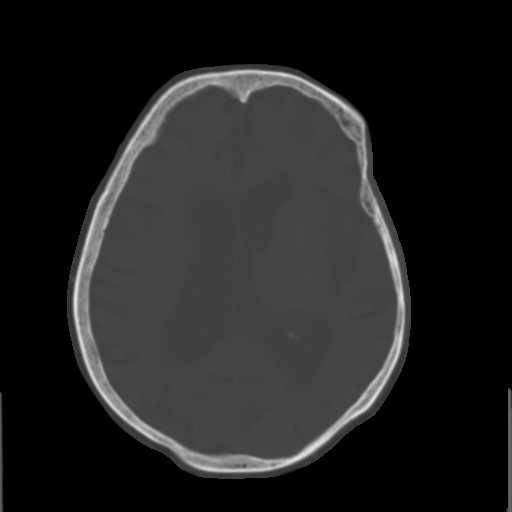
[im 19/31  brain]
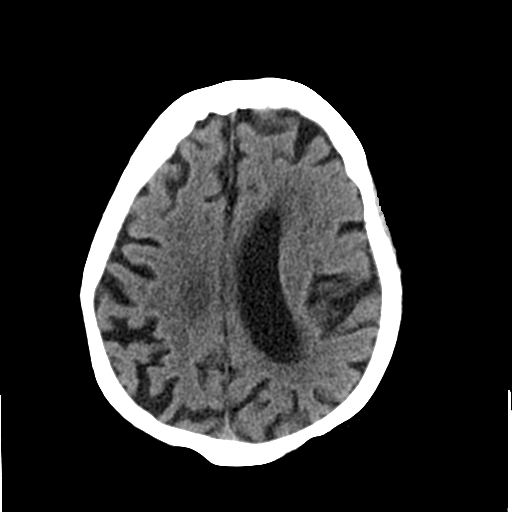
[im 22/31  brain]
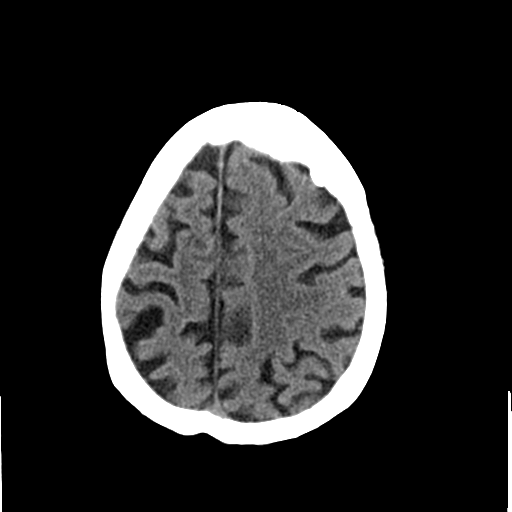
[im 25/31  brain]
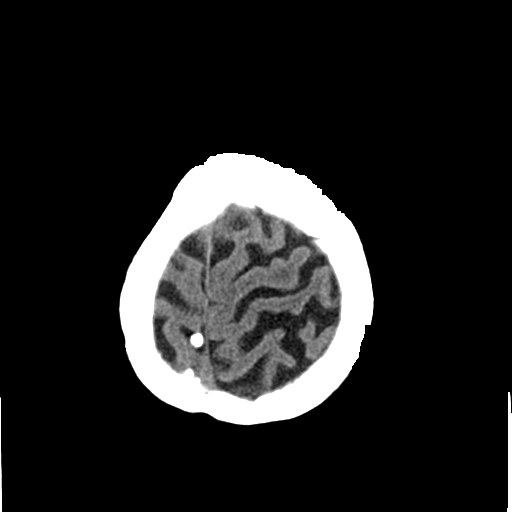
[im 28/31  brain]
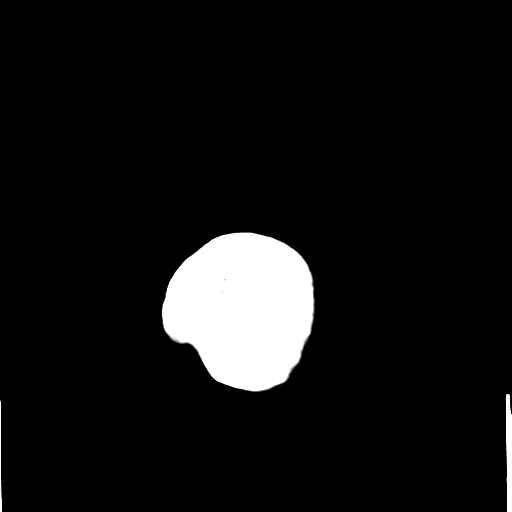
[im 28/31  bone]
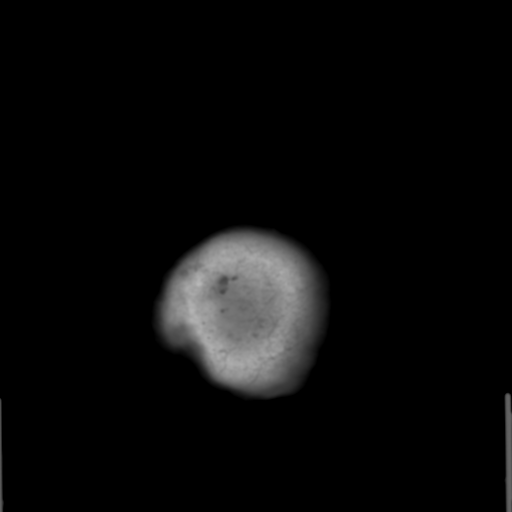

[Series 5: coronal soft tissue · coronal · 0.29mm/px · 3 of 66 slices shown]
[im 22/66  brain]
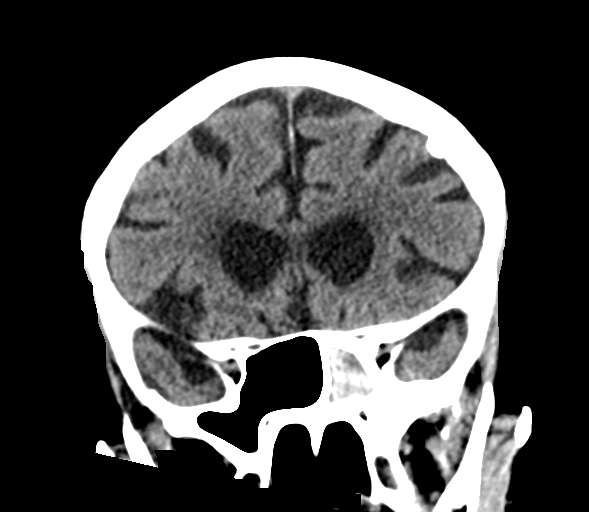
[im 29/66  brain]
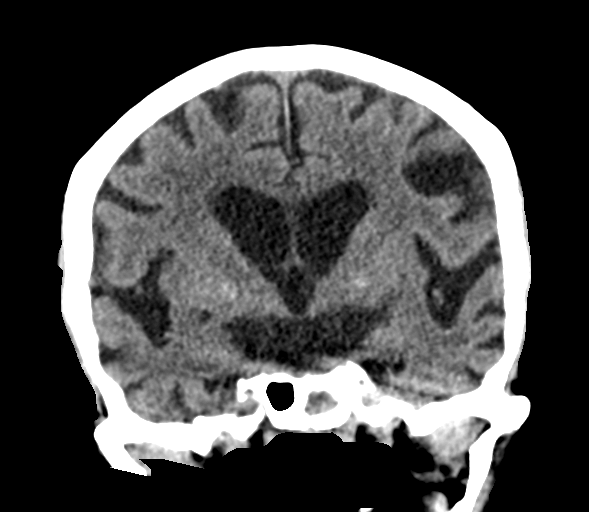
[im 37/66  brain]
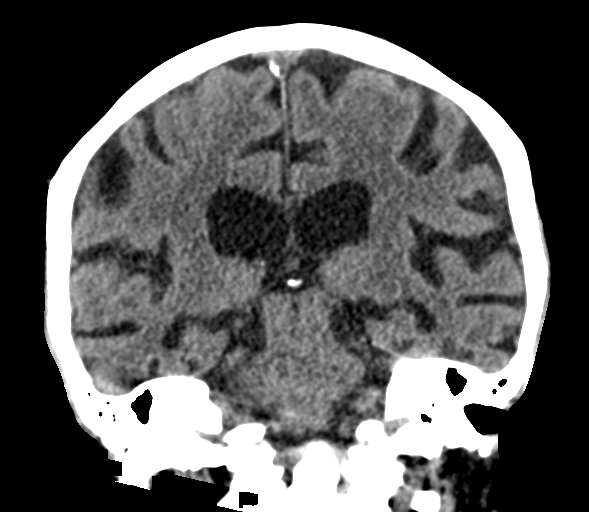

[Series 6: sagittal soft tissue · sagittal · 0.29mm/px · 3 of 57 slices shown]
[im 24/57  brain]
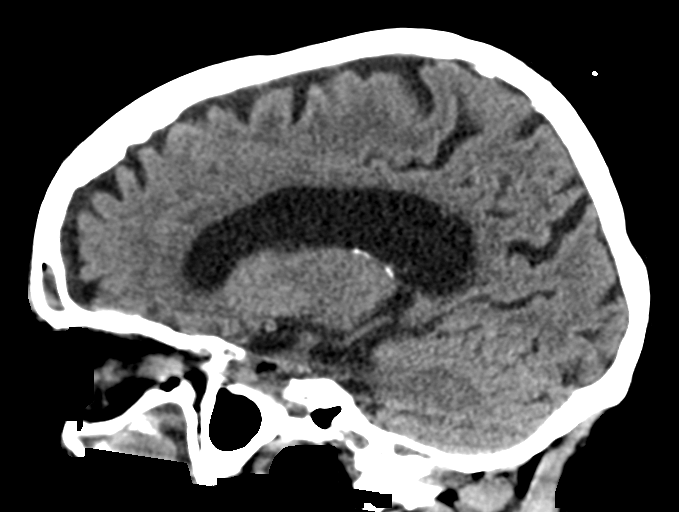
[im 29/57  brain]
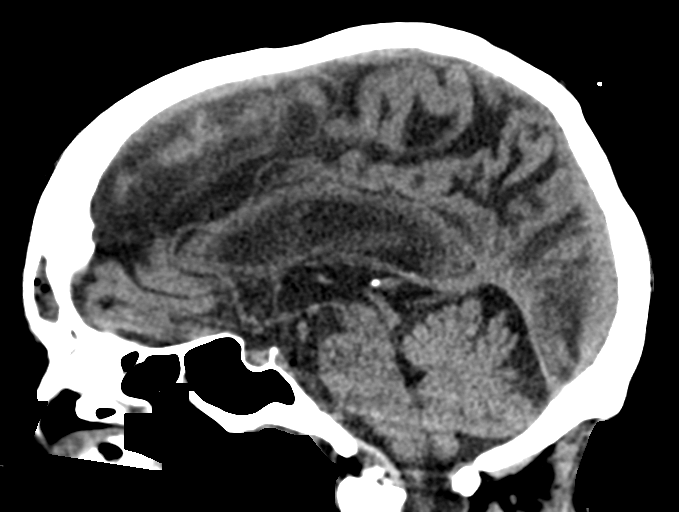
[im 33/57  brain]
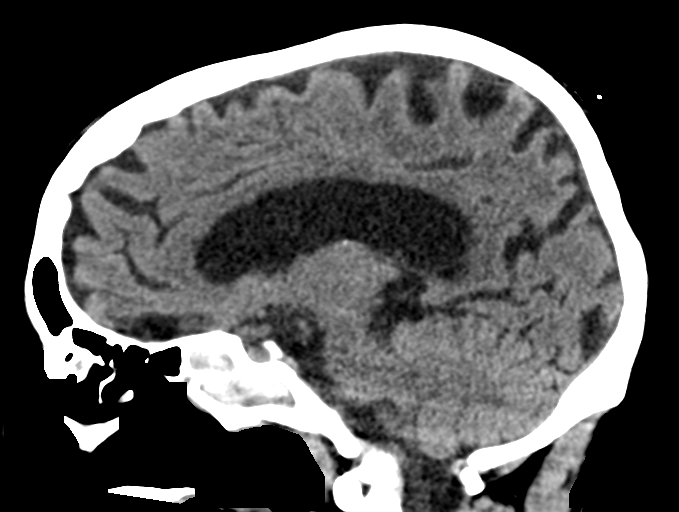

[15 of 47 positions shown; findings below may reference images not displayed]

FINDINGS: Brain: No evidence of acute infarction, hemorrhage, hydrocephalus,
extra-axial collection or mass lesion/mass effect. There is moderate
diffuse atrophy, unchanged. There is stable mild periventricular
white matter hypodensity, likely chronic small vessel ischemic
change.

Vascular: No hyperdense vessel or unexpected calcification.

Skull: Normal. Negative for fracture or focal lesion.

Sinuses/Orbits: There is complete opacification of the left sphenoid
sinus, right frontal sinus and bilateral maxillary sinuses. There is
some hyperdensity within the bilateral maxillary sinuses and left
sphenoid sinus. The mastoid air cells are clear.

Other: None.
IMPRESSION: 1. No acute intracranial process.
2. Bilateral maxillary, sphenoid and right frontal sinusitis.
Hyperdense material in the sinus can be seen with inspissated
secretions or fungal infection.
